# Patient Record
Sex: Female | Born: 1960
Health system: Southern US, Community
[De-identification: ages and names within clinical notes are randomized; demographics above are authoritative.]

## PROBLEM LIST (undated history)

## (undated) ENCOUNTER — Emergency Department (HOSPITAL_COMMUNITY): Discharge status: Absent without leave | Payer: Self-pay

## (undated) DIAGNOSIS — J302 Other seasonal allergic rhinitis: Secondary | ICD-10-CM

## (undated) DIAGNOSIS — L719 Rosacea, unspecified: Secondary | ICD-10-CM

## (undated) DIAGNOSIS — T7840XA Allergy, unspecified, initial encounter: Secondary | ICD-10-CM

## (undated) DIAGNOSIS — I499 Cardiac arrhythmia, unspecified: Secondary | ICD-10-CM

## (undated) DIAGNOSIS — H8110 Benign paroxysmal vertigo, unspecified ear: Secondary | ICD-10-CM

## (undated) DIAGNOSIS — J329 Chronic sinusitis, unspecified: Secondary | ICD-10-CM

## (undated) HISTORY — PX: OTHER SURGICAL HISTORY: SHX169

## (undated) HISTORY — DX: Cardiac arrhythmia, unspecified: I49.9

## (undated) HISTORY — DX: Chronic sinusitis, unspecified: J32.9

## (undated) HISTORY — PX: VAGINAL HYSTERECTOMY: SUR661

## (undated) HISTORY — PX: WISDOM TOOTH EXTRACTION: SHX21

## (undated) HISTORY — DX: Rosacea, unspecified: L71.9

## (undated) HISTORY — DX: Allergy, unspecified, initial encounter: T78.40XA

## (undated) HISTORY — DX: Other seasonal allergic rhinitis: J30.2

## (undated) HISTORY — PX: TONSILLECTOMY: SUR1361

## (undated) HISTORY — DX: Benign paroxysmal vertigo, unspecified ear: H81.10

---

## 2000-08-03 ENCOUNTER — Emergency Department (HOSPITAL_COMMUNITY): Admission: EM | Admit: 2000-08-03 | Discharge: 2000-08-03 | Payer: Self-pay | Admitting: Internal Medicine

## 2001-08-10 ENCOUNTER — Encounter (INDEPENDENT_AMBULATORY_CARE_PROVIDER_SITE_OTHER): Payer: Self-pay

## 2001-08-10 ENCOUNTER — Observation Stay (HOSPITAL_COMMUNITY): Admission: RE | Admit: 2001-08-10 | Discharge: 2001-08-11 | Payer: Self-pay | Admitting: Obstetrics and Gynecology

## 2002-12-22 HISTORY — PX: BREAST BIOPSY: SHX20

## 2002-12-22 HISTORY — PX: BREAST EXCISIONAL BIOPSY: SUR124

## 2005-12-02 ENCOUNTER — Encounter (INDEPENDENT_AMBULATORY_CARE_PROVIDER_SITE_OTHER): Payer: Self-pay | Admitting: *Deleted

## 2005-12-02 ENCOUNTER — Encounter: Admission: RE | Admit: 2005-12-02 | Discharge: 2005-12-02 | Payer: Self-pay | Admitting: General Surgery

## 2007-06-11 ENCOUNTER — Encounter: Admission: RE | Admit: 2007-06-11 | Discharge: 2007-06-11 | Payer: Self-pay | Admitting: Obstetrics and Gynecology

## 2007-09-08 ENCOUNTER — Encounter: Admission: RE | Admit: 2007-09-08 | Discharge: 2007-12-07 | Payer: Self-pay | Admitting: Orthopedic Surgery

## 2008-06-12 ENCOUNTER — Encounter: Admission: RE | Admit: 2008-06-12 | Discharge: 2008-06-12 | Payer: Self-pay | Admitting: Obstetrics and Gynecology

## 2009-06-29 ENCOUNTER — Encounter: Admission: RE | Admit: 2009-06-29 | Discharge: 2009-06-29 | Payer: Self-pay | Admitting: Obstetrics and Gynecology

## 2009-07-04 ENCOUNTER — Encounter: Admission: RE | Admit: 2009-07-04 | Discharge: 2009-07-04 | Payer: Self-pay | Admitting: Obstetrics and Gynecology

## 2010-03-01 ENCOUNTER — Encounter: Admission: RE | Admit: 2010-03-01 | Discharge: 2010-03-01 | Payer: Self-pay | Admitting: Family Medicine

## 2010-08-02 ENCOUNTER — Encounter: Admission: RE | Admit: 2010-08-02 | Discharge: 2010-08-02 | Payer: Self-pay | Admitting: Obstetrics and Gynecology

## 2010-12-22 HISTORY — PX: COLONOSCOPY: SHX174

## 2011-05-09 NOTE — H&P (Signed)
[facility]  Patient:    Adriana Romero, PARLIN Visit Number: [id number] MRN: [id number]          Service Type: Attending:  Katherine Roan, M.D. Adm. Date:  08/10/01                           History and Physical  CHIEF COMPLAINT:  Heavy painful periods.  HISTORY OF PRESENT ILLNESS:  Adriana Romero is a 50 year old, gravida 1, para 0, who presented to the office in June 2002 complaining of heavy painful periods. She gave the following history.  She had been followed for urinary frequency by her urologist, had severe dysmenorrhea and anemia.  She had been tried on numerous nonsteroidals which have been to no effect, and she desired definitive treatment.  Pap smear at that time was normal.  Blood work included anemia consistent with iron deficiency anemia and Pap smear was normal. Alternatives to definitive surgery have been discussed with Erskine Squibb, but she desires to proceed with hysterectomy.  MEDICATIONS:  Celexa, Chromagen, and Detrol.  ALLERGIES:  SULFA.  PAST MEDICAL HISTORY:  She has had LASIK eye surgery and has had a tonsillectomy.  REVIEW OF SYSTEMS:  GENERAL:  She has reading glasses.  She has narrowed angle glaucoma and had laser surgery for this.  HEART:  No history of hypertension, rheumatic fever, or heart murmur.  No history of mitral valve prolapse. LUNGS:  No chronic cough, asthma, or hay fever.  GU:  She has urgency and some urge incontinence, but no stress incontinence.  GI:  She has indigestion since being on iron and sometimes dizziness.  Denies any bowel habits change, melena, or weight loss/gain.  MUSCLES/BONES/JOINTS:  Negative.  No arthritis or fractures.  SOCIAL HISTORY:  She works at the [employer].  She drinks alcohol socially and does not smoke.  FAMILY HISTORY:  Her mother is 11 and has hypertension and emphysema.  Father is 73 and has had colon polyps and hypertension.  She has two brothers living and well and one sister living  and well.  She has a maternal grandmother with breast cancer.  PHYSICAL EXAMINATION:  VITAL SIGNS:  Weight 130, blood pressure 110/70, and pulse 80.  GENERAL:  Well-developed, well-nourished who appears to be her stated age. She is oriented to time, place, and recent events.  HEENT:  Unremarkable.  Oropharynx is not injected.  NECK:  Supple and thyroid not enlarged.  Carotid pulses are equal without bruits.  No adenopathy appreciated.  LUNGS:  Clear to auscultation and percussion.  HEART:  Normal sinus rhythm without murmurs.  BREASTS:  No masses or tenderness.  AXILLA:  Free from adenopathy.  ABDOMEN:  Soft, liver, spleen, and kidneys are not palpated.  Bowel sounds are normal and no bruits are heard.  No tenderness.  PELVIC:  Normal vulva and vagina.  Cervix is clean.  Uterus is anterior and tender.  Adnexa negative.  Rectovaginal confirms.  EXTREMITIES:  Good range of motion, equal pulses and reflexes.  IMPRESSION:  Severe dysmenorrhea, suggests endometriosis or adenomyosis.  PLAN:  Laparoscopic-assisted vaginal hysterectomy with possible TAH.  Risks and benefits and detailed informed consent have been given to husband and wife. Attending:  Katherine Roan, M.D. DD:  08/10/01 TD:  08/10/01 Job: [id number]

## 2011-05-09 NOTE — Op Note (Signed)
[facility]  Patient:    Adriana Romero, Adriana Romero Visit Number: [id number] MRN: [id number]          Service Type: DSU Location: DAY Attending Physician:  Lendon Colonel Proc. Date: 08/10/01 Adm. Date:  08/10/2001                             Operative Report  PREOPERATIVE DIAGNOSES:  Severe menorrhagia and dysmenorrhea unresponsive to conservative measures.  POSTOPERATIVE DIAGNOSES:  Severe menorrhagia and dysmenorrhea unresponsive to conservative measures, focal endometriosis of pelvis.  OPERATION PERFORMED:  Laparoscopically assisted vaginal hysterectomy.  DESCRIPTION OF PROCEDURE:  The patient was placed in lithotomy position, prepped and draped in the usual fashion for operative laparoscopy. A small transverse incision was made in the umbilicus and the Veress needle was inserted. Aspiration infusion was used and the abdomen was distended with 3 liters of CO2. A Hulka elevator was inserted into her uterus. After 3 liters of CO2 were infused, the 5 mm trocar was inserted into the umbilicus, a 5 mm scope was inserted. Visualization of the pelvis revealed the uterus to be enlarged with a small posterior fundal myoma, both ovaries were normal. There was a focal area of endometriosis in the cul-de-sac. A second 5 mm trocar was inserted in the midline and two lateral 5 mm trocars were utilized. These were inserted under direct vision.  Visualization of the pelvis was complete with a 5 mm scope. Using the 5 mm Seitzinger tripolar forceps, the utero-ovarian ligament, round ligament and tube were coagulated and cut. This was on each side. The bladder flap was created with scissors. Following this, we went down below and completed the vaginal hysterectomy by circumscribing the cervix and entering the cul-de-sac posteriorly and anteriorly, clamping the uterosacral and cardinal ligaments, uterine vessels and then removing the uterus. The uterus appeared to  be somewhat enlarged. Both ovaries were normal. The uterosacral ligaments were then carefully plicated in the midline with #0 Vicryl. Following this, we closed the peritoneum and the vagina in the vertical plane using interrupted sutures of #0 Vicryl. Following this, we went above and checked the pedicles. They were hemostatically secure. The trocar sites were hemostatically secure. The pelvis was washed with copious amounts of saline. The incisions were then closed with 4-0 PDS following which we infiltrated the incisions with 0.5% Marcaine with epinephrine. A Foley catheter was draining clear urine. [name] tolerated this procedure well and was sent to the recovery room in good condition. Attending Physician:  Lendon Colonel DD:  08/10/01 TD:  08/10/01 Job: [id number]

## 2011-07-17 ENCOUNTER — Other Ambulatory Visit: Payer: Self-pay | Admitting: Family Medicine

## 2011-07-17 DIAGNOSIS — N6312 Unspecified lump in the right breast, upper inner quadrant: Secondary | ICD-10-CM

## 2011-07-25 ENCOUNTER — Ambulatory Visit
Admission: RE | Admit: 2011-07-25 | Discharge: 2011-07-25 | Disposition: A | Discharge status: Home / Self Care | Payer: PRIVATE HEALTH INSURANCE | Source: Ambulatory Visit | Attending: Family Medicine | Admitting: Family Medicine

## 2011-07-25 DIAGNOSIS — N6312 Unspecified lump in the right breast, upper inner quadrant: Secondary | ICD-10-CM

## 2012-11-15 ENCOUNTER — Other Ambulatory Visit: Payer: Self-pay | Admitting: Dermatology

## 2012-11-23 ENCOUNTER — Other Ambulatory Visit: Payer: Self-pay | Admitting: Family Medicine

## 2012-11-23 DIAGNOSIS — Z1231 Encounter for screening mammogram for malignant neoplasm of breast: Secondary | ICD-10-CM

## 2012-12-24 ENCOUNTER — Ambulatory Visit
Admission: RE | Admit: 2012-12-24 | Discharge: 2012-12-24 | Disposition: A | Discharge status: Home / Self Care | Payer: BC Managed Care – PPO | Source: Ambulatory Visit | Attending: Family Medicine | Admitting: Family Medicine

## 2012-12-24 DIAGNOSIS — Z1231 Encounter for screening mammogram for malignant neoplasm of breast: Secondary | ICD-10-CM

## 2012-12-30 ENCOUNTER — Other Ambulatory Visit: Payer: Self-pay | Admitting: Family Medicine

## 2012-12-30 DIAGNOSIS — R928 Other abnormal and inconclusive findings on diagnostic imaging of breast: Secondary | ICD-10-CM

## 2013-01-07 ENCOUNTER — Ambulatory Visit
Admission: RE | Admit: 2013-01-07 | Discharge: 2013-01-07 | Disposition: A | Discharge status: Home / Self Care | Payer: BC Managed Care – PPO | Source: Ambulatory Visit | Attending: Family Medicine | Admitting: Family Medicine

## 2013-01-07 DIAGNOSIS — R928 Other abnormal and inconclusive findings on diagnostic imaging of breast: Secondary | ICD-10-CM

## 2013-03-15 ENCOUNTER — Other Ambulatory Visit: Payer: Self-pay | Admitting: Family Medicine

## 2013-03-15 DIAGNOSIS — R9389 Abnormal findings on diagnostic imaging of other specified body structures: Secondary | ICD-10-CM

## 2013-03-18 ENCOUNTER — Ambulatory Visit
Admission: RE | Admit: 2013-03-18 | Discharge: 2013-03-18 | Disposition: A | Discharge status: Home / Self Care | Payer: BC Managed Care – PPO | Source: Ambulatory Visit | Attending: Family Medicine | Admitting: Family Medicine

## 2013-03-18 DIAGNOSIS — R9389 Abnormal findings on diagnostic imaging of other specified body structures: Secondary | ICD-10-CM

## 2013-08-23 ENCOUNTER — Institutional Professional Consult (permissible substitution): Payer: BC Managed Care – PPO | Admitting: Pulmonary Disease

## 2013-08-25 ENCOUNTER — Ambulatory Visit (INDEPENDENT_AMBULATORY_CARE_PROVIDER_SITE_OTHER): Payer: BC Managed Care – PPO | Admitting: Pulmonary Disease

## 2013-08-25 ENCOUNTER — Encounter: Payer: Self-pay | Admitting: Pulmonary Disease

## 2013-08-25 ENCOUNTER — Ambulatory Visit (INDEPENDENT_AMBULATORY_CARE_PROVIDER_SITE_OTHER)
Admission: RE | Admit: 2013-08-25 | Discharge: 2013-08-25 | Disposition: A | Discharge status: Home / Self Care | Payer: BC Managed Care – PPO | Source: Ambulatory Visit | Attending: Pulmonary Disease | Admitting: Pulmonary Disease

## 2013-08-25 VITALS — BP 128/76 | HR 76 | Temp 98.8°F | Ht 66.0 in | Wt 157.0 lb

## 2013-08-25 DIAGNOSIS — R053 Chronic cough: Secondary | ICD-10-CM

## 2013-08-25 DIAGNOSIS — R059 Cough, unspecified: Secondary | ICD-10-CM

## 2013-08-25 DIAGNOSIS — R05 Cough: Secondary | ICD-10-CM | POA: Insufficient documentation

## 2013-08-25 MED ORDER — BENZONATATE 100 MG PO CAPS: ORAL_CAPSULE | ORAL | Status: DC

## 2013-08-25 MED ORDER — OMEPRAZOLE 40 MG PO CPDR: 40.0000 mg | DELAYED_RELEASE_CAPSULE | Freq: Two times a day (BID) | ORAL | Status: DC

## 2013-08-25 MED ORDER — BENZONATATE 100 MG PO CAPS: 100.0000 mg | ORAL_CAPSULE | Freq: Four times a day (QID) | ORAL | Status: DC | PRN

## 2013-08-25 NOTE — Addendum Note (Signed)
Addended by: Maisie Fus on: 08/25/2013 10:10 AM   Modules accepted: Orders

## 2013-08-25 NOTE — Progress Notes (Signed)
  Subjective:    Patient ID: MALAYSHA ARLEN, female    DOB: 01-Nov-1961, 52 y.o.   MRN: [id number]  HPI The pt is a 52 y/o female who I have been asked to see for chronic cough.  She has had this for approx 1 1/2 years, and thinks it started after having allergy issues.  It is dry and hacking in nature, and she clearly has a tickle/globus sensation in her throat.  She has frequent throat clearing.  It is worsened with conversation and smoke exposure, and improved with limiting voice use.  She has occasional severe cough paroxysms.  She thinks she has PND, but denies classic GERD symptoms.  She has been tried on flonase and low dose omeprazole without success.  The pt has not had a significant change in her exertional tolerance over the last 6-36mos, and has no hx of childhood asthma.  She has never smoked.  She has not had a cxr or spirometry.     Review of Systems  Constitutional: Positive for unexpected weight change. Negative for fever.  HENT: Negative for ear pain, nosebleeds, congestion, sore throat, rhinorrhea, sneezing, trouble swallowing, dental problem, postnasal drip and sinus pressure.   Eyes: Negative for redness and itching.  Respiratory: Positive for cough. Negative for chest tightness, shortness of breath and wheezing.   Cardiovascular: Positive for palpitations ( irregular heartbeat). Negative for leg swelling.  Gastrointestinal: Negative for nausea and vomiting.  Genitourinary: Negative for dysuria.  Musculoskeletal: Positive for joint swelling and arthralgias.  Skin: Negative for rash.  Neurological: Negative for headaches.  Hematological: Does not bruise/bleed easily.  Psychiatric/Behavioral: Negative for dysphoric mood. The patient is not nervous/anxious.        Objective:   Physical Exam Constitutional:  Well developed, no acute distress  HENT:  Nares patent without discharge, but she does have erythematous mucosa  Oropharynx without exudate, palate and uvula are  normal  Eyes:  Perrla, eomi, no scleral icterus  Neck:  No JVD, no TMG  Cardiovascular:  Normal rate, regular rhythm, no rubs or gallops.  No murmurs        Intact distal pulses  Pulmonary :  Normal breath sounds, no stridor or respiratory distress   No rales, rhonchi, or wheezing  Abdominal:  Soft, nondistended, bowel sounds present.  No tenderness noted.   Musculoskeletal:  No lower extremity edema noted.  Lymph Nodes:  No cervical lymphadenopathy noted  Skin:  No cyanosis noted  Neurologic:  Alert, appropriate, moves all 4 extremities without obvious deficit.         Assessment & Plan:

## 2013-08-25 NOTE — Assessment & Plan Note (Addendum)
The patient has a chronic cough for greater than one year duration, and from her description it is very likely to be upper airway in origin.  Her spirometry is normal today (although normal spiro doesn't 100% exclude cough variant asthma), her lungs are clear, and will check cxr today for completeness.  I have reviewed the various causes of upper airway cough, including postnasal drip, LPR, and cyclical coughing.  Will try aggressive treatment of these entities, and have told pt this will be a slow process.

## 2013-08-25 NOTE — Patient Instructions (Addendum)
Will check a cxr today for completeness, and call you with results. Minimize voice use as much as possible at work, and NO talking while at home No throat clearing.  Use hard candy (no mint or menthol, no cough drops..use jolly ranchers, butterscotch,etc) to bathe the back of your throat from sun-up to sundown.  Can also drink water or just swallow if you have a tickle in your throat.   Will try tessalon pearls as directed to help with cough during the day.  It will be very important to suppress cough as much as possible.  Chlorpheniramine 4mg  OTC, and take 2 at bedtime and one at lunch daily Omeprazole 40mg  one in am and pm before eating Please call in 2 weeks to give me an update, but keep in mind this will probably be a slow process.

## 2013-08-25 NOTE — Addendum Note (Signed)
Addended by: Maisie Fus on: 08/25/2013 10:00 AM   Modules accepted: Orders

## 2013-09-08 ENCOUNTER — Telehealth: Payer: Self-pay | Admitting: Pulmonary Disease

## 2013-09-08 NOTE — Telephone Encounter (Signed)
Please let pt know to continue the high dose omeprazole, chlorpheniramine, and most importantly the behavioral therapies of voice rest and hard candy during day to prevent throat clearing.  Ok to refill tessalon pearls, #30 with one fill. It sounds like she is some better, and would like to do this for another 2 weeks.  Would like to see her in office in 2 weeks.  Make a spot for her somewhere.

## 2013-09-08 NOTE — Telephone Encounter (Signed)
Called spoke with patient, advised of KC's recs as stated below.  Pt verbalized her understanding and denied any questions.  Pt did state that she never refilled her first rx for the tessalon and will do this, so no need to send rx per pt.  Pt stated that if she does need this prior to her 2 week follow up, she will call the office.  Appt scheduled with KC 10.3.14 @ 4.30pm.  Nothing further needed; will sign off. Will sign and forward to [facility] as FYI for appt date/time.

## 2013-09-08 NOTE — Telephone Encounter (Signed)
Spoke with the pt and she is calling to give a 2 week update on her cough. She states she still feels the urge to cough often especially when she speaks. She states she forces herself not to cough but feels the urge. She states she is following all recs. She wants to know is she to continue the omeprazole twice daily, chlor tabs 2 at bedtime and one at lunch? And she states she has only 3 tabs left of tessalon perles. She wants to know can she have a refill on these to have on hand for an as needed basis? Please advise. Not on File

## 2013-09-23 ENCOUNTER — Encounter: Payer: Self-pay | Admitting: Pulmonary Disease

## 2013-09-23 ENCOUNTER — Ambulatory Visit (INDEPENDENT_AMBULATORY_CARE_PROVIDER_SITE_OTHER): Payer: BC Managed Care – PPO | Admitting: Pulmonary Disease

## 2013-09-23 ENCOUNTER — Other Ambulatory Visit: Payer: Self-pay | Admitting: Pulmonary Disease

## 2013-09-23 VITALS — BP 110/68 | HR 80 | Temp 97.0°F | Ht 66.0 in | Wt 156.0 lb

## 2013-09-23 DIAGNOSIS — R059 Cough, unspecified: Secondary | ICD-10-CM

## 2013-09-23 DIAGNOSIS — R053 Chronic cough: Secondary | ICD-10-CM

## 2013-09-23 DIAGNOSIS — R05 Cough: Secondary | ICD-10-CM

## 2013-09-23 MED ORDER — OMEPRAZOLE 40 MG PO CPDR: 40.0000 mg | DELAYED_RELEASE_CAPSULE | Freq: Every day | ORAL | Status: DC

## 2013-09-23 NOTE — Telephone Encounter (Signed)
See telephone note. This has been addressed. 

## 2013-09-23 NOTE — Telephone Encounter (Signed)
Please advise on refill. Thanks. 

## 2013-09-23 NOTE — Patient Instructions (Addendum)
Decrease your omeprazole to once a day for 7 days, then try and discontinue. Stay on your allergy medication thru fall allergy season, and call me if your symptoms are not controlled.  Continue with behavioral therapies, start to liberalize voice use but do not sing or yell. Please call if you have escalation of your cough.

## 2013-09-23 NOTE — Assessment & Plan Note (Signed)
The patient's cough is almost totally resolved with treatment of reflux, postnasal drip, and cyclical coughing.  At this point, I would like to try and get her off proton pump inhibitor therapy, but would like her to continue allergy treatment through the fall season.  Most importantly, she is to continue behavioral therapies to keep her cough from escalating.

## 2013-09-23 NOTE — Progress Notes (Signed)
  Subjective:    Patient ID: Adriana Romero, female    DOB: 1961-04-14, 52 y.o.   MRN: [id number]  HPI Patient comes in today for followup of her chronic cough.  She has been treated with an antihistamine and also a proton pump inhibitor, as well as behavioral therapies.  She tells me that her cough is less than one scale of 10.  The only time that she has a sensation difficulties when she uses her voice.  She has stuck with behavioral therapy strictly since the last visit.   Review of Systems  Constitutional: Negative for fever and unexpected weight change.  HENT: Positive for sneezing and postnasal drip. Negative for ear pain, nosebleeds, congestion, sore throat, rhinorrhea, trouble swallowing, dental problem and sinus pressure.   Eyes: Negative for redness and itching.  Respiratory: Positive for cough. Negative for chest tightness, shortness of breath and wheezing.   Cardiovascular: Negative for palpitations and leg swelling.  Gastrointestinal: Negative for nausea and vomiting.  Genitourinary: Negative for dysuria.  Musculoskeletal: Negative for joint swelling.  Skin: Negative for rash.  Neurological: Negative for headaches.  Hematological: Does not bruise/bleed easily.  Psychiatric/Behavioral: Negative for dysphoric mood. The patient is not nervous/anxious.        Objective:   Physical Exam Well-developed female in no acute distress Nose without purulence or discharge noted Neck without lymphadenopathy or thyromegaly Extremities without edema, no cyanosis Alert and oriented, moves all 4 extremities.       Assessment & Plan:

## 2013-10-27 ENCOUNTER — Other Ambulatory Visit: Payer: Self-pay

## 2013-12-09 ENCOUNTER — Other Ambulatory Visit: Payer: Self-pay

## 2013-12-09 DIAGNOSIS — Z1231 Encounter for screening mammogram for malignant neoplasm of breast: Secondary | ICD-10-CM

## 2014-01-13 ENCOUNTER — Ambulatory Visit
Admission: RE | Admit: 2014-01-13 | Discharge: 2014-01-13 | Disposition: A | Discharge status: Home / Self Care | Payer: BC Managed Care – PPO | Source: Ambulatory Visit

## 2014-01-13 DIAGNOSIS — Z1231 Encounter for screening mammogram for malignant neoplasm of breast: Secondary | ICD-10-CM

## 2014-10-06 ENCOUNTER — Other Ambulatory Visit: Payer: Self-pay

## 2015-04-05 ENCOUNTER — Other Ambulatory Visit: Payer: Self-pay

## 2015-04-05 DIAGNOSIS — Z1231 Encounter for screening mammogram for malignant neoplasm of breast: Secondary | ICD-10-CM

## 2015-04-20 ENCOUNTER — Ambulatory Visit
Admission: RE | Admit: 2015-04-20 | Discharge: 2015-04-20 | Disposition: A | Discharge status: Home / Self Care | Payer: BLUE CROSS/BLUE SHIELD | Source: Ambulatory Visit

## 2015-04-20 DIAGNOSIS — Z1231 Encounter for screening mammogram for malignant neoplasm of breast: Secondary | ICD-10-CM

## 2015-09-19 ENCOUNTER — Encounter: Payer: Self-pay | Admitting: Gynecology

## 2015-09-19 ENCOUNTER — Ambulatory Visit (INDEPENDENT_AMBULATORY_CARE_PROVIDER_SITE_OTHER): Payer: BLUE CROSS/BLUE SHIELD | Admitting: Gynecology

## 2015-09-19 ENCOUNTER — Other Ambulatory Visit (HOSPITAL_COMMUNITY)
Admission: RE | Admit: 2015-09-19 | Discharge: 2015-09-19 | Disposition: A | Discharge status: Home / Self Care | Payer: BLUE CROSS/BLUE SHIELD | Source: Ambulatory Visit | Attending: Gynecology | Admitting: Gynecology

## 2015-09-19 VITALS — BP 116/70 | Ht 66.0 in | Wt 149.0 lb

## 2015-09-19 DIAGNOSIS — N952 Postmenopausal atrophic vaginitis: Secondary | ICD-10-CM

## 2015-09-19 DIAGNOSIS — N951 Menopausal and female climacteric states: Secondary | ICD-10-CM | POA: Diagnosis not present

## 2015-09-19 DIAGNOSIS — Z1272 Encounter for screening for malignant neoplasm of vagina: Secondary | ICD-10-CM

## 2015-09-19 DIAGNOSIS — N898 Other specified noninflammatory disorders of vagina: Secondary | ICD-10-CM | POA: Diagnosis not present

## 2015-09-19 DIAGNOSIS — Z01419 Encounter for gynecological examination (general) (routine) without abnormal findings: Secondary | ICD-10-CM | POA: Insufficient documentation

## 2015-09-19 LAB — WET PREP FOR TRICH, YEAST, CLUE
TRICH WET PREP: NONE SEEN
YEAST WET PREP: NONE SEEN

## 2015-09-19 MED ORDER — ESTRADIOL 0.075 MG/24HR TD PTTW: 1.0000 | MEDICATED_PATCH | TRANSDERMAL | Status: DC

## 2015-09-19 NOTE — Patient Instructions (Signed)
Start on the estrogen patches as we discussed. Call me if you have any issues with this. Follow up in one year if doing well.

## 2015-09-19 NOTE — Progress Notes (Signed)
Adriana Romero March 27, 1961 [id number]        54 y.o.  G1P0010 New patient presents in consultation from Marda Stalker secondary to worsening hot flashes, night sweats, vaginal dryness with significant dyspareunia. Status post vaginal hysterectomy in her early 52s for persistent abnormal bleeding. Transiently tried Premarin but was discontinued secondary to some concern about thrombosis. Father with history of embolus in his 57s. Grandmother with history of DVT at an elderly age. No other family history. Has tried over-the-counter moisturizers and lubricants but feels like razor blades when he has intercourse. Heart flashes and night sweats have been going on for 7-8 years without relief. Has tried over-the-counter products without success.  Recently had a full exam by her primary physician to include pelvic and breast exam. Relates not having had a Pap smear in a number of years.  Past medical history,surgical history, problem list, medications, allergies, family history and social history were all reviewed and documented in the EPIC chart.  Directed ROS with pertinent positives and negatives documented in the history of present illness/assessment and plan.  Exam: Kim assistant Filed Vitals:   09/19/15 1053  BP: 116/70  Height: 5\' 6"  (1.676 m)  Weight: 149 lb (67.586 kg)   General appearance:  Normal Both breasts examined lying and sitting without masses retractions discharge adenopathy. Abdomen soft nontender without masses guarding rebound Pelvic external BUS vagina with atrophic changes. Slight white discharge noted. Pap smear of vaginal cuff done. Bimanual without masses or tenderness. Rectal exam is normal.  Assessment/Plan:  54 y.o. G1P0010 with significant postmenopausal symptoms to include hot flashes and night sweats, vaginal dryness and dyspareunia. Options for management were reviewed to include over-the-counter products to address both the global symptoms and the vaginal symptoms.  Pharmacologic nonhormonal such as Effexor which apparently she has tried in the past without success. Estrogen such as vaginal estrogen cream/rings/Vagifem and Osphena. Risks and benefits of each choice was reviewed.  Global ERT with various routes to include pills patch vaginal ring topical lotions/sprays all discussed. The WHI study with increased risks of stroke heart attack DVT and breast cancer reviewed. Does have a history of DVT/PE although both elderly in occurrence. Possibilities of genetic hypercoagulable state and availability of genetic screening discussed. Given the sparse history at this point we both feel comfortable not screening but she does understand the issues. After a lengthy discussion she does want to try ERT and will initiate with estradiol 0.075 mg patch. Benefits from a thrombosis standpoint for transdermal absorption discussed. Patient will call me if she does not receive a satisfactory result as far as hot flashes/night sweats relief within the first month and vaginal symptoms within the first 2 months. Use of oil based vaginal lubricants also discussed. Assuming patient does have a good result then she'll follow expectantly and see me in one year otherwise she'll follow up sooner with any issues.   She is status post hysterectomy for benign indications. I reviewed current Pap smear screening recommendations and the options to stop screening discussed. As it has been a number of years since she has had a Pap smear and she does relate some abnormal Pap smears when she was much younger I did a baseline Pap smear today.    Anastasio Auerbach MD, 12:00 PM 09/19/2015

## 2015-09-19 NOTE — Addendum Note (Signed)
Addended by: Nelva Nay on: 09/19/2015 12:27 PM   Modules accepted: Orders

## 2015-09-20 ENCOUNTER — Telehealth: Payer: Self-pay | Admitting: *Deleted

## 2015-09-20 NOTE — Telephone Encounter (Signed)
Pt called was prescribed estradiol 0.075 mg patch on OV 09/19/15, was concerned if she wanted to stop the patch after 6 months would this be okay. Pt read some of the side effects, I explained to patient that she could stop anytime if she wanted to, would just need to call office to find out how to wean off Rx. Pt was fine with this and will call if further questions.

## 2015-09-24 LAB — CYTOLOGY - PAP

## 2015-10-02 ENCOUNTER — Telehealth: Payer: Self-pay | Admitting: *Deleted

## 2015-10-02 NOTE — Telephone Encounter (Signed)
Pt was started on estradiol 0.075 mg patch on 09/19/15 OV, wishes to stop taking HRT asked how to wean off? Please Arnoldo Hooker

## 2015-10-02 NOTE — Telephone Encounter (Signed)
Left instructions on her voice mail per [facility] access note. To call if questions.

## 2015-10-02 NOTE — Telephone Encounter (Signed)
Cut patch in half and use half a patch twice weekly for 2 weeks. Cut again in one half (one quarter of a whole patch) and do this for 2 weeks and then stop.

## 2016-03-12 ENCOUNTER — Other Ambulatory Visit: Payer: Self-pay

## 2016-03-12 DIAGNOSIS — Z1231 Encounter for screening mammogram for malignant neoplasm of breast: Secondary | ICD-10-CM

## 2016-03-14 ENCOUNTER — Other Ambulatory Visit: Payer: Self-pay | Admitting: Family Medicine

## 2016-03-14 DIAGNOSIS — N644 Mastodynia: Secondary | ICD-10-CM

## 2016-03-21 ENCOUNTER — Ambulatory Visit
Admission: RE | Admit: 2016-03-21 | Discharge: 2016-03-21 | Disposition: A | Discharge status: Home / Self Care | Payer: BLUE CROSS/BLUE SHIELD | Source: Ambulatory Visit | Attending: Family Medicine | Admitting: Family Medicine

## 2016-03-21 ENCOUNTER — Other Ambulatory Visit: Payer: Self-pay | Admitting: Family Medicine

## 2016-03-21 DIAGNOSIS — N644 Mastodynia: Secondary | ICD-10-CM

## 2016-05-14 DIAGNOSIS — D2262 Melanocytic nevi of left upper limb, including shoulder: Secondary | ICD-10-CM | POA: Diagnosis not present

## 2016-05-14 DIAGNOSIS — L738 Other specified follicular disorders: Secondary | ICD-10-CM | POA: Diagnosis not present

## 2016-05-14 DIAGNOSIS — L814 Other melanin hyperpigmentation: Secondary | ICD-10-CM | POA: Diagnosis not present

## 2016-05-14 DIAGNOSIS — L718 Other rosacea: Secondary | ICD-10-CM | POA: Diagnosis not present

## 2016-06-10 DIAGNOSIS — H2513 Age-related nuclear cataract, bilateral: Secondary | ICD-10-CM | POA: Diagnosis not present

## 2016-06-10 DIAGNOSIS — H35371 Puckering of macula, right eye: Secondary | ICD-10-CM | POA: Diagnosis not present

## 2016-06-10 DIAGNOSIS — H5203 Hypermetropia, bilateral: Secondary | ICD-10-CM | POA: Diagnosis not present

## 2016-06-11 DIAGNOSIS — L720 Epidermal cyst: Secondary | ICD-10-CM | POA: Diagnosis not present

## 2016-09-03 DIAGNOSIS — Z0000 Encounter for general adult medical examination without abnormal findings: Secondary | ICD-10-CM | POA: Diagnosis not present

## 2016-09-03 DIAGNOSIS — Z1159 Encounter for screening for other viral diseases: Secondary | ICD-10-CM | POA: Diagnosis not present

## 2016-09-03 DIAGNOSIS — Z23 Encounter for immunization: Secondary | ICD-10-CM | POA: Diagnosis not present

## 2016-12-23 DIAGNOSIS — R3915 Urgency of urination: Secondary | ICD-10-CM | POA: Diagnosis not present

## 2017-03-31 DIAGNOSIS — M722 Plantar fascial fibromatosis: Secondary | ICD-10-CM | POA: Diagnosis not present

## 2017-06-12 DIAGNOSIS — H35371 Puckering of macula, right eye: Secondary | ICD-10-CM | POA: Diagnosis not present

## 2017-06-12 DIAGNOSIS — H5203 Hypermetropia, bilateral: Secondary | ICD-10-CM | POA: Diagnosis not present

## 2017-08-17 ENCOUNTER — Other Ambulatory Visit: Payer: Self-pay | Admitting: Family Medicine

## 2017-08-17 DIAGNOSIS — Z1231 Encounter for screening mammogram for malignant neoplasm of breast: Secondary | ICD-10-CM

## 2017-08-28 ENCOUNTER — Ambulatory Visit: Payer: BLUE CROSS/BLUE SHIELD

## 2017-08-31 ENCOUNTER — Ambulatory Visit
Admission: RE | Admit: 2017-08-31 | Discharge: 2017-08-31 | Disposition: A | Discharge status: Home / Self Care | Payer: BLUE CROSS/BLUE SHIELD | Source: Ambulatory Visit | Attending: Family Medicine | Admitting: Family Medicine

## 2017-08-31 DIAGNOSIS — Z1231 Encounter for screening mammogram for malignant neoplasm of breast: Secondary | ICD-10-CM

## 2017-09-08 DIAGNOSIS — Z13220 Encounter for screening for lipoid disorders: Secondary | ICD-10-CM | POA: Diagnosis not present

## 2017-09-08 DIAGNOSIS — Z0000 Encounter for general adult medical examination without abnormal findings: Secondary | ICD-10-CM | POA: Diagnosis not present

## 2017-09-08 DIAGNOSIS — Z23 Encounter for immunization: Secondary | ICD-10-CM | POA: Diagnosis not present

## 2018-06-04 DIAGNOSIS — Z780 Asymptomatic menopausal state: Secondary | ICD-10-CM | POA: Diagnosis not present

## 2018-06-09 DIAGNOSIS — M79672 Pain in left foot: Secondary | ICD-10-CM | POA: Diagnosis not present

## 2018-06-22 DIAGNOSIS — M79672 Pain in left foot: Secondary | ICD-10-CM | POA: Diagnosis not present

## 2018-07-02 DIAGNOSIS — H2513 Age-related nuclear cataract, bilateral: Secondary | ICD-10-CM | POA: Diagnosis not present

## 2018-07-02 DIAGNOSIS — H35371 Puckering of macula, right eye: Secondary | ICD-10-CM | POA: Diagnosis not present

## 2018-07-02 DIAGNOSIS — H5203 Hypermetropia, bilateral: Secondary | ICD-10-CM | POA: Diagnosis not present

## 2018-07-15 DIAGNOSIS — M25572 Pain in left ankle and joints of left foot: Secondary | ICD-10-CM | POA: Diagnosis not present

## 2018-07-15 DIAGNOSIS — M898X7 Other specified disorders of bone, ankle and foot: Secondary | ICD-10-CM | POA: Diagnosis not present

## 2018-07-16 DIAGNOSIS — Z90710 Acquired absence of both cervix and uterus: Secondary | ICD-10-CM | POA: Diagnosis not present

## 2018-07-16 DIAGNOSIS — R6882 Decreased libido: Secondary | ICD-10-CM | POA: Diagnosis not present

## 2018-07-16 DIAGNOSIS — Z780 Asymptomatic menopausal state: Secondary | ICD-10-CM | POA: Diagnosis not present

## 2018-07-16 DIAGNOSIS — N941 Unspecified dyspareunia: Secondary | ICD-10-CM | POA: Diagnosis not present

## 2018-08-03 DIAGNOSIS — D225 Melanocytic nevi of trunk: Secondary | ICD-10-CM | POA: Diagnosis not present

## 2018-08-03 DIAGNOSIS — D1801 Hemangioma of skin and subcutaneous tissue: Secondary | ICD-10-CM | POA: Diagnosis not present

## 2018-08-03 DIAGNOSIS — L570 Actinic keratosis: Secondary | ICD-10-CM | POA: Diagnosis not present

## 2018-08-03 DIAGNOSIS — L918 Other hypertrophic disorders of the skin: Secondary | ICD-10-CM | POA: Diagnosis not present

## 2018-08-03 DIAGNOSIS — D2262 Melanocytic nevi of left upper limb, including shoulder: Secondary | ICD-10-CM | POA: Diagnosis not present

## 2018-08-18 DIAGNOSIS — M25572 Pain in left ankle and joints of left foot: Secondary | ICD-10-CM | POA: Diagnosis not present

## 2018-08-18 DIAGNOSIS — M25561 Pain in right knee: Secondary | ICD-10-CM | POA: Diagnosis not present

## 2018-09-02 DIAGNOSIS — L720 Epidermal cyst: Secondary | ICD-10-CM | POA: Diagnosis not present

## 2018-09-09 DIAGNOSIS — Z4802 Encounter for removal of sutures: Secondary | ICD-10-CM | POA: Diagnosis not present

## 2018-09-10 DIAGNOSIS — N941 Unspecified dyspareunia: Secondary | ICD-10-CM | POA: Diagnosis not present

## 2018-09-10 DIAGNOSIS — Z23 Encounter for immunization: Secondary | ICD-10-CM | POA: Diagnosis not present

## 2018-09-10 DIAGNOSIS — Z136 Encounter for screening for cardiovascular disorders: Secondary | ICD-10-CM | POA: Diagnosis not present

## 2018-09-10 DIAGNOSIS — Z1211 Encounter for screening for malignant neoplasm of colon: Secondary | ICD-10-CM | POA: Diagnosis not present

## 2018-09-10 DIAGNOSIS — Z0000 Encounter for general adult medical examination without abnormal findings: Secondary | ICD-10-CM | POA: Diagnosis not present

## 2018-09-24 DIAGNOSIS — M25572 Pain in left ankle and joints of left foot: Secondary | ICD-10-CM | POA: Diagnosis not present

## 2018-10-15 DIAGNOSIS — N951 Menopausal and female climacteric states: Secondary | ICD-10-CM | POA: Diagnosis not present

## 2018-10-15 DIAGNOSIS — R6882 Decreased libido: Secondary | ICD-10-CM | POA: Diagnosis not present

## 2018-10-15 DIAGNOSIS — Z780 Asymptomatic menopausal state: Secondary | ICD-10-CM | POA: Diagnosis not present

## 2018-10-15 DIAGNOSIS — N941 Unspecified dyspareunia: Secondary | ICD-10-CM | POA: Diagnosis not present

## 2019-01-22 DIAGNOSIS — H35041 Retinal micro-aneurysms, unspecified, right eye: Secondary | ICD-10-CM

## 2019-01-22 HISTORY — DX: Retinal micro-aneurysms, unspecified, right eye: H35.041

## 2019-02-02 ENCOUNTER — Other Ambulatory Visit: Payer: Self-pay | Admitting: Family Medicine

## 2019-02-02 DIAGNOSIS — Z1231 Encounter for screening mammogram for malignant neoplasm of breast: Secondary | ICD-10-CM

## 2019-02-08 DIAGNOSIS — R599 Enlarged lymph nodes, unspecified: Secondary | ICD-10-CM | POA: Diagnosis not present

## 2019-02-08 DIAGNOSIS — K219 Gastro-esophageal reflux disease without esophagitis: Secondary | ICD-10-CM | POA: Diagnosis not present

## 2019-02-25 ENCOUNTER — Ambulatory Visit
Admission: RE | Admit: 2019-02-25 | Discharge: 2019-02-25 | Disposition: A | Discharge status: Home / Self Care | Payer: BLUE CROSS/BLUE SHIELD | Source: Ambulatory Visit | Attending: Family Medicine | Admitting: Family Medicine

## 2019-02-25 DIAGNOSIS — Z1231 Encounter for screening mammogram for malignant neoplasm of breast: Secondary | ICD-10-CM

## 2019-02-25 DIAGNOSIS — H3500 Unspecified background retinopathy: Secondary | ICD-10-CM | POA: Diagnosis not present

## 2019-03-01 DIAGNOSIS — H3581 Retinal edema: Secondary | ICD-10-CM | POA: Diagnosis not present

## 2019-03-01 DIAGNOSIS — H3561 Retinal hemorrhage, right eye: Secondary | ICD-10-CM | POA: Diagnosis not present

## 2019-03-01 DIAGNOSIS — H33103 Unspecified retinoschisis, bilateral: Secondary | ICD-10-CM | POA: Diagnosis not present

## 2019-03-01 DIAGNOSIS — H35033 Hypertensive retinopathy, bilateral: Secondary | ICD-10-CM | POA: Diagnosis not present

## 2019-03-01 DIAGNOSIS — H4311 Vitreous hemorrhage, right eye: Secondary | ICD-10-CM | POA: Diagnosis not present

## 2019-03-09 DIAGNOSIS — H3561 Retinal hemorrhage, right eye: Secondary | ICD-10-CM | POA: Diagnosis not present

## 2019-03-09 DIAGNOSIS — H3581 Retinal edema: Secondary | ICD-10-CM | POA: Diagnosis not present

## 2019-03-09 DIAGNOSIS — H53131 Sudden visual loss, right eye: Secondary | ICD-10-CM | POA: Diagnosis not present

## 2019-03-09 DIAGNOSIS — H4311 Vitreous hemorrhage, right eye: Secondary | ICD-10-CM | POA: Diagnosis not present

## 2019-03-09 DIAGNOSIS — H34831 Tributary (branch) retinal vein occlusion, right eye, with macular edema: Secondary | ICD-10-CM | POA: Diagnosis not present

## 2019-03-10 DIAGNOSIS — H3561 Retinal hemorrhage, right eye: Secondary | ICD-10-CM | POA: Diagnosis not present

## 2019-03-10 DIAGNOSIS — H53131 Sudden visual loss, right eye: Secondary | ICD-10-CM | POA: Diagnosis not present

## 2019-03-10 DIAGNOSIS — H34831 Tributary (branch) retinal vein occlusion, right eye, with macular edema: Secondary | ICD-10-CM | POA: Diagnosis not present

## 2019-03-10 DIAGNOSIS — H3581 Retinal edema: Secondary | ICD-10-CM | POA: Diagnosis not present

## 2019-03-10 DIAGNOSIS — H35351 Cystoid macular degeneration, right eye: Secondary | ICD-10-CM | POA: Diagnosis not present

## 2019-03-11 DIAGNOSIS — H34831 Tributary (branch) retinal vein occlusion, right eye, with macular edema: Secondary | ICD-10-CM | POA: Diagnosis not present

## 2019-03-18 DIAGNOSIS — H3509 Other intraretinal microvascular abnormalities: Secondary | ICD-10-CM | POA: Diagnosis not present

## 2019-03-21 DIAGNOSIS — H35033 Hypertensive retinopathy, bilateral: Secondary | ICD-10-CM | POA: Diagnosis not present

## 2019-03-21 DIAGNOSIS — H35351 Cystoid macular degeneration, right eye: Secondary | ICD-10-CM | POA: Diagnosis not present

## 2019-03-21 DIAGNOSIS — H3581 Retinal edema: Secondary | ICD-10-CM | POA: Diagnosis not present

## 2019-03-21 DIAGNOSIS — H3561 Retinal hemorrhage, right eye: Secondary | ICD-10-CM | POA: Diagnosis not present

## 2019-03-24 DIAGNOSIS — H53411 Scotoma involving central area, right eye: Secondary | ICD-10-CM | POA: Diagnosis not present

## 2019-03-24 DIAGNOSIS — H538 Other visual disturbances: Secondary | ICD-10-CM | POA: Diagnosis not present

## 2019-03-24 DIAGNOSIS — H3581 Retinal edema: Secondary | ICD-10-CM | POA: Diagnosis not present

## 2019-03-24 DIAGNOSIS — H35351 Cystoid macular degeneration, right eye: Secondary | ICD-10-CM | POA: Diagnosis not present

## 2019-03-24 DIAGNOSIS — H34831 Tributary (branch) retinal vein occlusion, right eye, with macular edema: Secondary | ICD-10-CM | POA: Diagnosis not present

## 2019-03-24 DIAGNOSIS — H35721 Serous detachment of retinal pigment epithelium, right eye: Secondary | ICD-10-CM | POA: Diagnosis not present

## 2019-03-24 DIAGNOSIS — H5315 Visual distortions of shape and size: Secondary | ICD-10-CM | POA: Diagnosis not present

## 2019-03-31 DIAGNOSIS — H35351 Cystoid macular degeneration, right eye: Secondary | ICD-10-CM | POA: Diagnosis not present

## 2019-04-13 DIAGNOSIS — H34831 Tributary (branch) retinal vein occlusion, right eye, with macular edema: Secondary | ICD-10-CM | POA: Diagnosis not present

## 2019-05-20 DIAGNOSIS — H34831 Tributary (branch) retinal vein occlusion, right eye, with macular edema: Secondary | ICD-10-CM | POA: Diagnosis not present

## 2019-06-22 DIAGNOSIS — H34831 Tributary (branch) retinal vein occlusion, right eye, with macular edema: Secondary | ICD-10-CM | POA: Diagnosis not present

## 2019-07-29 DIAGNOSIS — H34831 Tributary (branch) retinal vein occlusion, right eye, with macular edema: Secondary | ICD-10-CM | POA: Diagnosis not present

## 2019-09-02 DIAGNOSIS — H34831 Tributary (branch) retinal vein occlusion, right eye, with macular edema: Secondary | ICD-10-CM | POA: Diagnosis not present

## 2019-09-08 IMAGING — MG DIGITAL SCREENING BILATERAL MAMMOGRAM WITH TOMO AND CAD
8 series · 8 of 24 positions shown · non-contrast
Comparison: Previous exam(s).

CLINICAL DATA: Screening.

EXAM:
DIGITAL SCREENING BILATERAL MAMMOGRAM WITH TOMO AND CAD

[R MLO synth-2D]
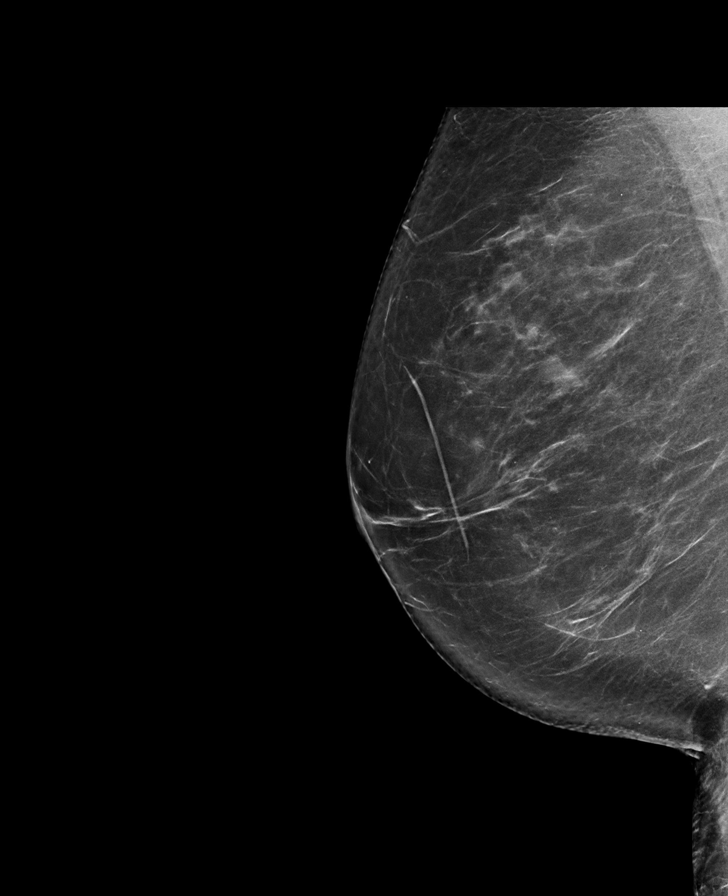

[L CC synth-2D]
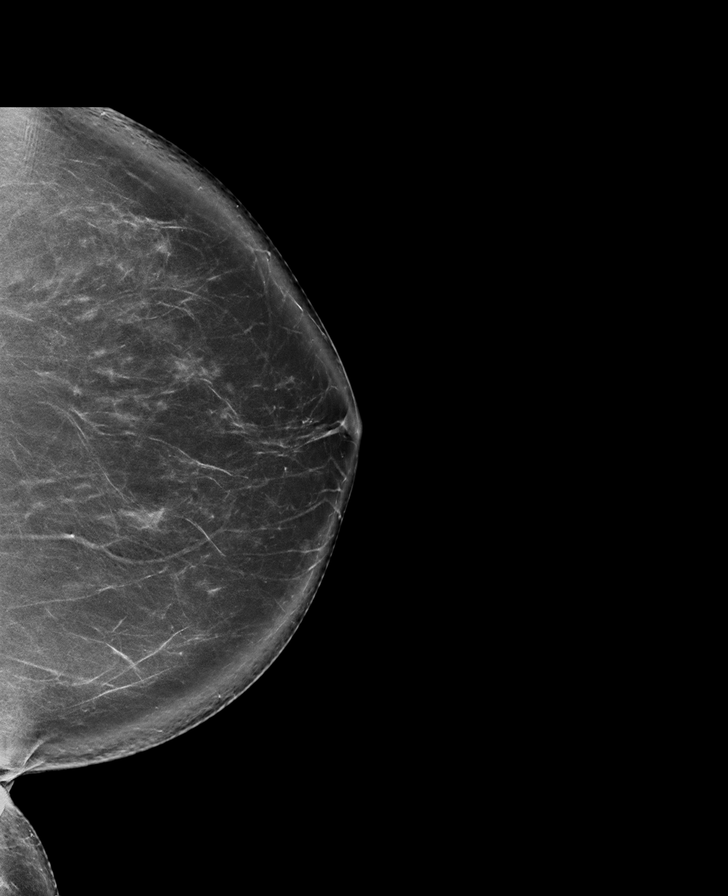

[L MLO synth-2D]
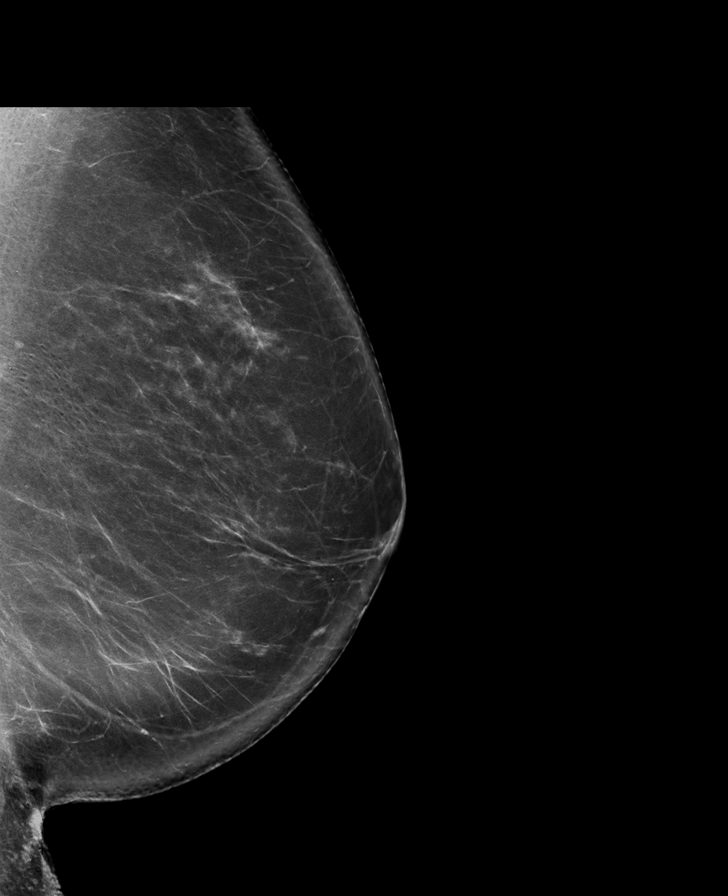

[R CC synth-2D]
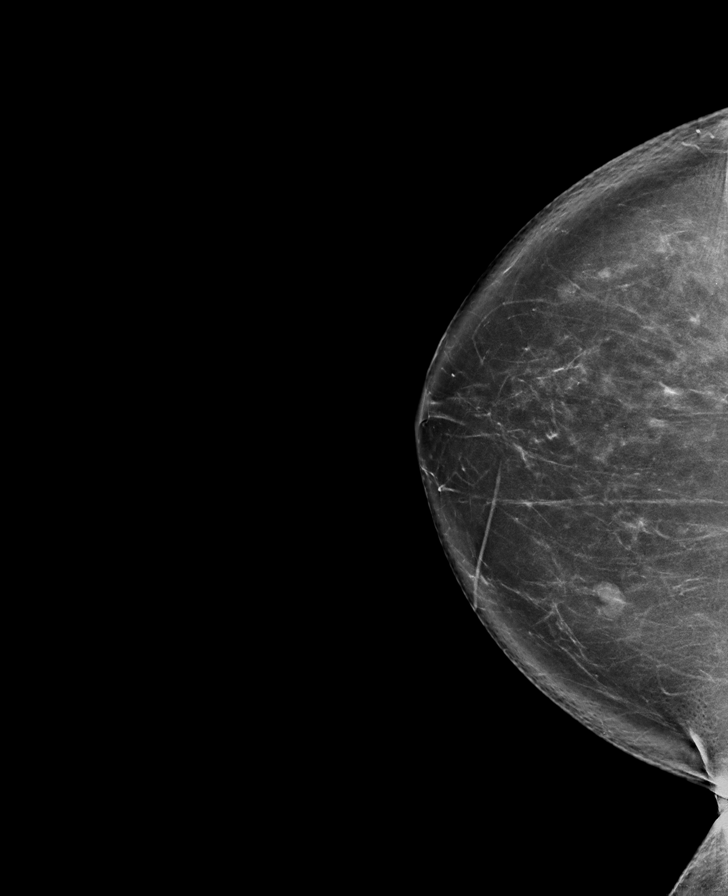

[R MLO tomo · tomo slice 45/89.0]
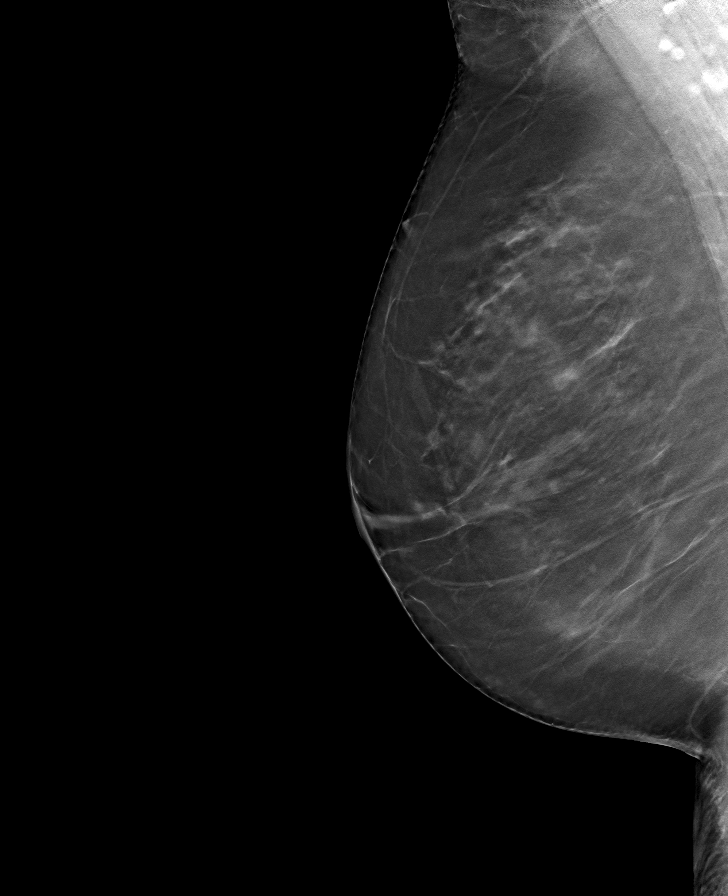

[R CC tomo · tomo slice 47/94.0]
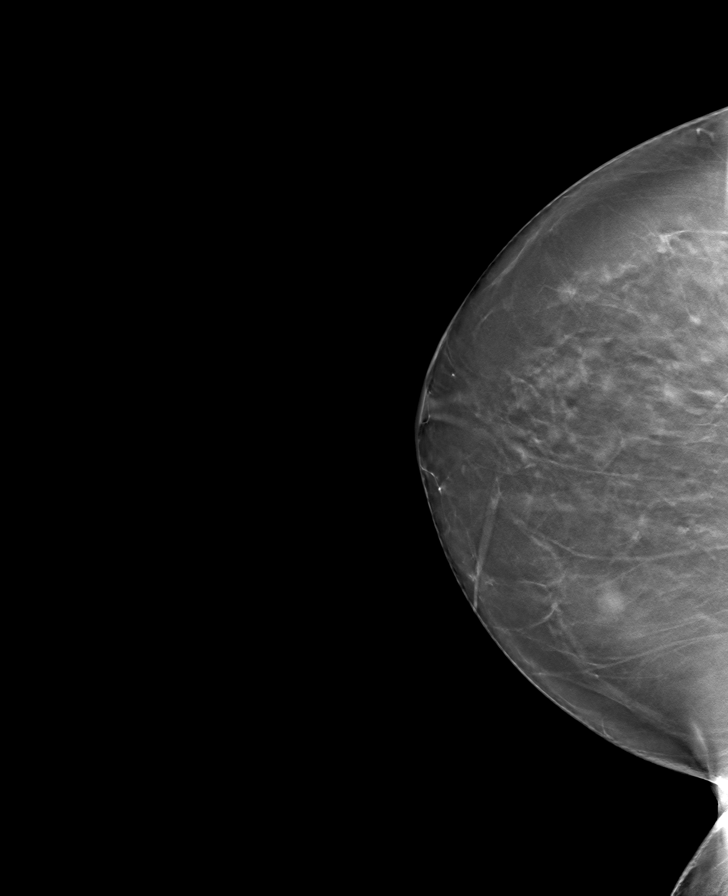

[L MLO tomo · tomo slice 49/96.0]
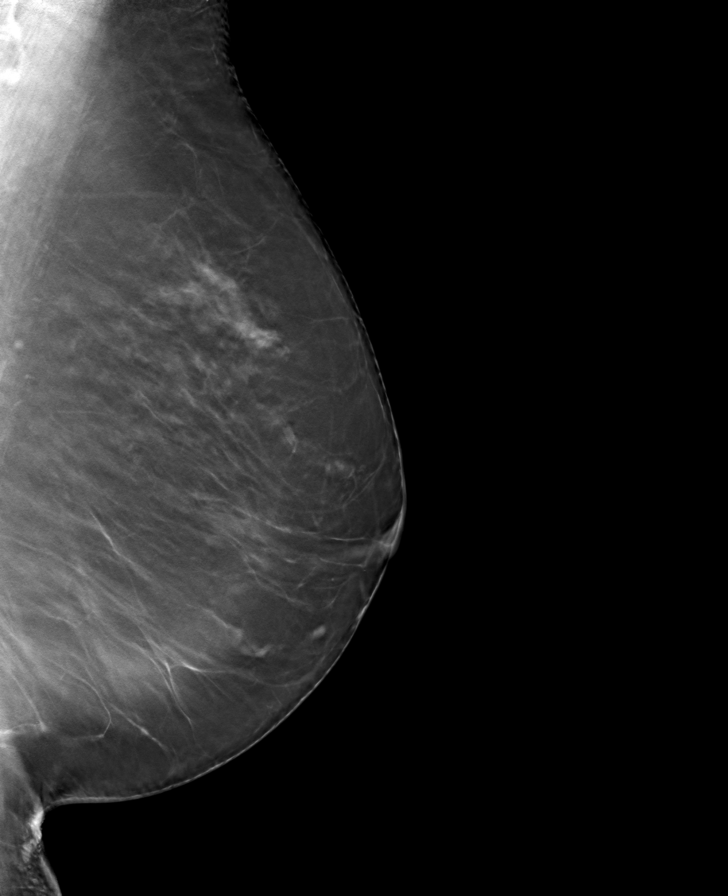

[L CC tomo · tomo slice 50/99.0]
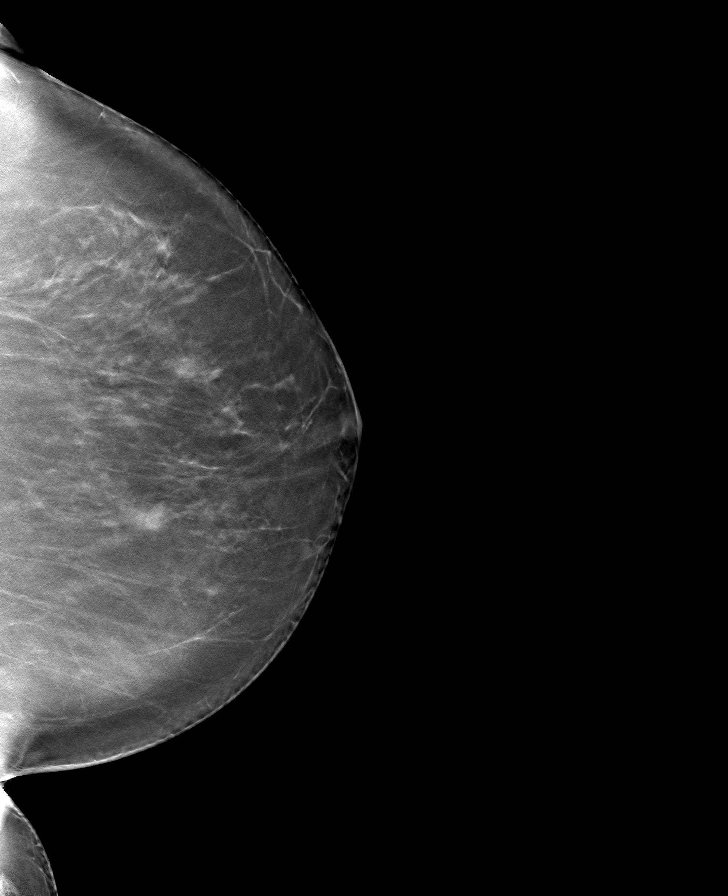

[8 of 24 positions shown; findings below may reference images not displayed]

ACR Breast Density Category b: There are scattered areas of
fibroglandular density.
FINDINGS: There are no findings suspicious for malignancy. Images were
processed with CAD.
IMPRESSION: No mammographic evidence of malignancy. A result letter of this
screening mammogram will be mailed directly to the patient.

RECOMMENDATION:
Screening mammogram in one year. (Code:[id number])

BI-RADS CATEGORY  1: Negative.

## 2019-09-09 DIAGNOSIS — N941 Unspecified dyspareunia: Secondary | ICD-10-CM | POA: Diagnosis not present

## 2019-09-09 DIAGNOSIS — Z13220 Encounter for screening for lipoid disorders: Secondary | ICD-10-CM | POA: Diagnosis not present

## 2019-09-09 DIAGNOSIS — Z23 Encounter for immunization: Secondary | ICD-10-CM | POA: Diagnosis not present

## 2019-09-09 DIAGNOSIS — Z1211 Encounter for screening for malignant neoplasm of colon: Secondary | ICD-10-CM | POA: Diagnosis not present

## 2019-09-09 DIAGNOSIS — Z0000 Encounter for general adult medical examination without abnormal findings: Secondary | ICD-10-CM | POA: Diagnosis not present

## 2019-09-16 DIAGNOSIS — D225 Melanocytic nevi of trunk: Secondary | ICD-10-CM | POA: Diagnosis not present

## 2019-09-16 DIAGNOSIS — D1801 Hemangioma of skin and subcutaneous tissue: Secondary | ICD-10-CM | POA: Diagnosis not present

## 2019-09-16 DIAGNOSIS — L821 Other seborrheic keratosis: Secondary | ICD-10-CM | POA: Diagnosis not present

## 2019-09-16 DIAGNOSIS — L570 Actinic keratosis: Secondary | ICD-10-CM | POA: Diagnosis not present

## 2019-09-16 DIAGNOSIS — L814 Other melanin hyperpigmentation: Secondary | ICD-10-CM | POA: Diagnosis not present

## 2019-09-20 ENCOUNTER — Encounter: Payer: Self-pay | Admitting: Gynecology

## 2019-10-13 DIAGNOSIS — H34831 Tributary (branch) retinal vein occlusion, right eye, with macular edema: Secondary | ICD-10-CM | POA: Diagnosis not present

## 2019-10-17 DIAGNOSIS — H34831 Tributary (branch) retinal vein occlusion, right eye, with macular edema: Secondary | ICD-10-CM | POA: Diagnosis not present

## 2019-10-17 DIAGNOSIS — H5711 Ocular pain, right eye: Secondary | ICD-10-CM | POA: Diagnosis not present

## 2019-10-17 DIAGNOSIS — H3581 Retinal edema: Secondary | ICD-10-CM | POA: Diagnosis not present

## 2019-10-17 DIAGNOSIS — H11431 Conjunctival hyperemia, right eye: Secondary | ICD-10-CM | POA: Diagnosis not present

## 2019-11-01 ENCOUNTER — Other Ambulatory Visit: Payer: Self-pay

## 2019-11-01 DIAGNOSIS — Z20822 Contact with and (suspected) exposure to covid-19: Secondary | ICD-10-CM

## 2019-11-02 LAB — NOVEL CORONAVIRUS, NAA: SARS-CoV-2, NAA: NOT DETECTED

## 2019-12-02 DIAGNOSIS — H34831 Tributary (branch) retinal vein occlusion, right eye, with macular edema: Secondary | ICD-10-CM | POA: Diagnosis not present

## 2020-01-06 DIAGNOSIS — H5203 Hypermetropia, bilateral: Secondary | ICD-10-CM | POA: Diagnosis not present

## 2020-01-06 DIAGNOSIS — H35371 Puckering of macula, right eye: Secondary | ICD-10-CM | POA: Diagnosis not present

## 2020-01-27 DIAGNOSIS — H34831 Tributary (branch) retinal vein occlusion, right eye, with macular edema: Secondary | ICD-10-CM | POA: Diagnosis not present

## 2020-02-25 ENCOUNTER — Ambulatory Visit: Payer: BC Managed Care – PPO | Attending: Internal Medicine

## 2020-02-25 DIAGNOSIS — Z23 Encounter for immunization: Secondary | ICD-10-CM | POA: Insufficient documentation

## 2020-02-25 NOTE — Progress Notes (Signed)
   Covid-19 Vaccination Clinic  Name:  Adriana Romero    MRN: [id number] DOB: [birth year]  02/25/2020  [name] was observed post Covid-19 immunization for 15 minutes without incident. She was provided with Vaccine Information Sheet and instruction to access the V-Safe system.   Ms. Down was instructed to call 911 with any severe reactions post vaccine: Marland Kitchen Difficulty breathing  . Swelling of face and throat  . A fast heartbeat  . A bad rash all over body  . Dizziness and weakness   Immunizations Administered    Name Date Dose VIS Date Route   Pfizer COVID-19 Vaccine 02/25/2020 12:30 PM 0.3 mL 12/02/2019 Intramuscular   Manufacturer: [place]   Lot: [id number]   [place]: [id number]

## 2020-03-15 ENCOUNTER — Other Ambulatory Visit: Payer: Self-pay | Admitting: Family Medicine

## 2020-03-15 DIAGNOSIS — Z1231 Encounter for screening mammogram for malignant neoplasm of breast: Secondary | ICD-10-CM

## 2020-03-17 ENCOUNTER — Ambulatory Visit: Payer: BC Managed Care – PPO | Attending: Internal Medicine

## 2020-03-17 DIAGNOSIS — Z23 Encounter for immunization: Secondary | ICD-10-CM

## 2020-03-17 NOTE — Progress Notes (Signed)
   Covid-19 Vaccination Clinic  Name:  Adriana Romero    MRN: [id number] DOB: [birth year]  03/17/2020  [name] was observed post Covid-19 immunization for 15 minutes without incident. She was provided with Vaccine Information Sheet and instruction to access the V-Safe system.   [name] was instructed to call 911 with any severe reactions post vaccine: Marland Kitchen Difficulty breathing  . Swelling of face and throat  . A fast heartbeat  . A bad rash all over body  . Dizziness and weakness   Immunizations Administered    Name Date Dose VIS Date Route   Pfizer COVID-19 Vaccine 03/17/2020 12:24 PM 0.3 mL 12/02/2019 Intramuscular   Manufacturer: [place]   Lot: [id number]   [place]: [id number]

## 2020-04-04 DIAGNOSIS — H34831 Tributary (branch) retinal vein occlusion, right eye, with macular edema: Secondary | ICD-10-CM | POA: Diagnosis not present

## 2020-04-27 ENCOUNTER — Other Ambulatory Visit: Payer: Self-pay

## 2020-04-27 ENCOUNTER — Ambulatory Visit
Admission: RE | Admit: 2020-04-27 | Discharge: 2020-04-27 | Disposition: A | Discharge status: Home / Self Care | Payer: BC Managed Care – PPO | Source: Ambulatory Visit | Attending: Family Medicine | Admitting: Family Medicine

## 2020-04-27 DIAGNOSIS — Z1231 Encounter for screening mammogram for malignant neoplasm of breast: Secondary | ICD-10-CM | POA: Diagnosis not present

## 2020-05-11 ENCOUNTER — Ambulatory Visit (INDEPENDENT_AMBULATORY_CARE_PROVIDER_SITE_OTHER): Payer: BC Managed Care – PPO | Admitting: Otolaryngology

## 2020-05-11 ENCOUNTER — Other Ambulatory Visit: Payer: Self-pay

## 2020-05-11 VITALS — Temp 97.2°F

## 2020-05-11 DIAGNOSIS — J310 Chronic rhinitis: Secondary | ICD-10-CM | POA: Diagnosis not present

## 2020-05-11 DIAGNOSIS — K219 Gastro-esophageal reflux disease without esophagitis: Secondary | ICD-10-CM | POA: Diagnosis not present

## 2020-05-11 NOTE — Progress Notes (Signed)
HPI: Adriana Romero is a 59 y.o. female who presents is referred by her PCP for evaluation of chronic coughing and chronic throat clearing.  She has had this for about 5 years now.  She has tried omeprazole in the past as well as allergy medications including Claritin Allegra and Zyrtec.  She also notices a bad smell in her nose.  She denies any colored mucus discharge from her nose.  No fever.  She has never seen an allergist. She does snore. She is presently on no PPI. Her cough is mostly nonproductive.  Past Medical History:  Diagnosis Date  . Abnormal heart rhythm   . BPPV (benign paroxysmal positional vertigo)   . Rosacea   . Seasonal allergies    Past Surgical History:  Procedure Laterality Date  . BREAST BIOPSY  2004  . BREAST EXCISIONAL BIOPSY Right 2004  . right ulner nerve transposition     2005  . TONSILLECTOMY     age 68  . VAGINAL HYSTERECTOMY  age 37   irregular bleeding  . WISDOM TOOTH EXTRACTION     Social History   Socioeconomic History  . Marital status: Married    Spouse name: Not on file  . Number of children: Not on file  . Years of education: Not on file  . Highest education level: Not on file  Occupational History  . Occupation: Horticulturist, commercial  Tobacco Use  . Smoking status: Never Smoker  Substance and Sexual Activity  . Alcohol use: Yes    Alcohol/week: 0.0 standard drinks    Comment: occassional  . Drug use: No  . Sexual activity: Yes    Comment: 1st intercourse 59 yo-More than 5 partners  Other Topics Concern  . Not on file  Social History Narrative  . Not on file   Social Determinants of Health   Financial Resource Strain:   . Difficulty of Paying Living Expenses:   Food Insecurity:   . Worried About Charity fundraiser in the Last Year:   . Arboriculturist in the Last Year:   Transportation Needs:   . Film/video editor (Medical):   Marland Kitchen Lack of Transportation (Non-Medical):   Physical Activity:   . Days of Exercise per Week:    . Minutes of Exercise per Session:   Stress:   . Feeling of Stress :   Social Connections:   . Frequency of Communication with Friends and Family:   . Frequency of Social Gatherings with Friends and Family:   . Attends Religious Services:   . Active Member of Clubs or Organizations:   . Attends Archivist Meetings:   Marland Kitchen Marital Status:    Family History  Problem Relation Age of Onset  . Emphysema Mother   . Heart disease Mother   . Allergies Brother   . Skin cancer Brother   . Asthma Other        nephew  . Allergies Other        nephew  . Clotting disorder Father   . Skin cancer Father   . Hypertension Father   . Skin cancer Sister   . Breast cancer Maternal Grandmother 67  . Cancer Paternal Aunt        Colon   Allergies  Allergen Reactions  . Sulfa Antibiotics     Has never been given this bc mother was allergic to it   Prior to Admission medications   Medication Sig Start Date End Date Taking? Authorizing  Provider  Azelaic Acid (FINACEA) 15 % FOAM Apply topically.   Yes [provider]  estradiol (VIVELLE-DOT) 0.075 MG/24HR Place 1 patch onto the skin 2 (two) times a week. 09/19/15  Yes Fontaine, Belinda Block, MD  Loratadine (CLARITIN PO) Take by mouth.   Yes [provider]     Positive ROS: Otherwise negative  All other systems have been reviewed and were otherwise negative with the exception of those mentioned in the HPI and as above.  Physical Exam: Constitutional: Alert, well-appearing, no acute distress.  She is chronically clearing her throat in the office today. Ears: External ears without lesions or tenderness. Ear canals are clear bilaterally with intact, clear TMs.  Nasal: External nose without lesions. Septum with mild deformity.  Moderate rhinitis with clear mucus discharge.  No polyps noted.  Both middle meatus regions were clear with no active mucopurulent discharge noted..  Minimal clear mucus within the nasal cavity. Oral:  Lips and gums without lesions. Tongue and palate mucosa without lesions. Posterior oropharynx clear.  Patient is status post tonsillectomy. Indirect laryngoscopy revealed a clear base of tongue vallecula and epiglottis.  Vocal cords were clear with normal vocal cord mobility and no significant supraglottic mucus noted. Neck: No palpable adenopathy or masses Respiratory: Breathing comfortably  Skin: No facial/neck lesions or rash noted.  Procedures  Assessment: Chronic throat clearing and cough I suspect is probably related to laryngeal pharyngeal reflux. She does have mild rhinitis.  Questionable sinus disease.  Plan: Placed on omeprazole 40 mg daily before dinner. Nasacort 2 sprays each nostril at night and saline rinse as needed nasal drainage. We will plan on obtaining a CT scan of her sinuses to evaluate for any chronic sinus condition. She will follow-up in 5 to 6 weeks for recheck.   Radene Journey, MD   CC:

## 2020-05-14 ENCOUNTER — Other Ambulatory Visit (INDEPENDENT_AMBULATORY_CARE_PROVIDER_SITE_OTHER): Payer: Self-pay

## 2020-05-14 DIAGNOSIS — J329 Chronic sinusitis, unspecified: Secondary | ICD-10-CM

## 2020-06-01 ENCOUNTER — Ambulatory Visit
Admission: RE | Admit: 2020-06-01 | Discharge: 2020-06-01 | Disposition: A | Discharge status: Home / Self Care | Payer: BC Managed Care – PPO | Source: Ambulatory Visit | Attending: Otolaryngology | Admitting: Otolaryngology

## 2020-06-01 DIAGNOSIS — J329 Chronic sinusitis, unspecified: Secondary | ICD-10-CM

## 2020-06-22 ENCOUNTER — Ambulatory Visit (INDEPENDENT_AMBULATORY_CARE_PROVIDER_SITE_OTHER): Payer: BC Managed Care – PPO | Admitting: Otolaryngology

## 2020-06-29 ENCOUNTER — Ambulatory Visit (INDEPENDENT_AMBULATORY_CARE_PROVIDER_SITE_OTHER): Payer: BC Managed Care – PPO | Admitting: Otolaryngology

## 2020-07-02 ENCOUNTER — Telehealth (INDEPENDENT_AMBULATORY_CARE_PROVIDER_SITE_OTHER): Payer: Self-pay | Admitting: Otolaryngology

## 2020-07-02 MED ORDER — AMOXICILLIN-POT CLAVULANATE 875-125 MG PO TABS: 1.0000 | ORAL_TABLET | Freq: Two times a day (BID) | ORAL | 0 refills | Status: DC

## 2020-07-02 NOTE — Telephone Encounter (Signed)
Called Adriana Romero today concerning results of her recent CT scan of her sinuses.  This showed chronic left maxillary sinus infection with air-fluid level in mucoperiosteal thickening.  She also has a septal spur to the left.  Remaining sinuses were fairly clear.  No infection on the right side.. She apparently has had chronic recurring sinus issues for a number of years but wants to avoid surgery. Discussed with her concerning taking antibiotics and will prescribe Augmentin 875 mg twice daily for 2weeks.  We will send this to [facility]. She will follow up here as needed any further sinus issues.

## 2020-07-12 ENCOUNTER — Other Ambulatory Visit (INDEPENDENT_AMBULATORY_CARE_PROVIDER_SITE_OTHER): Payer: Self-pay

## 2020-07-12 MED ORDER — OMEPRAZOLE 40 MG PO CPDR
40.0000 mg | DELAYED_RELEASE_CAPSULE | Freq: Every day | ORAL | 2 refills | Status: DC
Start: 2020-07-12 — End: 2022-02-17

## 2020-07-13 DIAGNOSIS — H34831 Tributary (branch) retinal vein occlusion, right eye, with macular edema: Secondary | ICD-10-CM | POA: Diagnosis not present

## 2020-08-23 DIAGNOSIS — H903 Sensorineural hearing loss, bilateral: Secondary | ICD-10-CM | POA: Diagnosis not present

## 2020-09-24 DIAGNOSIS — H34831 Tributary (branch) retinal vein occlusion, right eye, with macular edema: Secondary | ICD-10-CM | POA: Diagnosis not present

## 2020-09-24 DIAGNOSIS — H3561 Retinal hemorrhage, right eye: Secondary | ICD-10-CM | POA: Diagnosis not present

## 2020-09-24 DIAGNOSIS — H35033 Hypertensive retinopathy, bilateral: Secondary | ICD-10-CM | POA: Diagnosis not present

## 2020-09-24 DIAGNOSIS — H33103 Unspecified retinoschisis, bilateral: Secondary | ICD-10-CM | POA: Diagnosis not present

## 2020-09-27 DIAGNOSIS — Z0000 Encounter for general adult medical examination without abnormal findings: Secondary | ICD-10-CM | POA: Diagnosis not present

## 2020-09-27 DIAGNOSIS — Z13220 Encounter for screening for lipoid disorders: Secondary | ICD-10-CM | POA: Diagnosis not present

## 2020-09-27 DIAGNOSIS — Z23 Encounter for immunization: Secondary | ICD-10-CM | POA: Diagnosis not present

## 2020-09-27 DIAGNOSIS — M79672 Pain in left foot: Secondary | ICD-10-CM | POA: Diagnosis not present

## 2020-09-27 DIAGNOSIS — M5442 Lumbago with sciatica, left side: Secondary | ICD-10-CM | POA: Diagnosis not present

## 2020-09-28 DIAGNOSIS — L738 Other specified follicular disorders: Secondary | ICD-10-CM | POA: Diagnosis not present

## 2020-09-28 DIAGNOSIS — L570 Actinic keratosis: Secondary | ICD-10-CM | POA: Diagnosis not present

## 2020-09-28 DIAGNOSIS — L821 Other seborrheic keratosis: Secondary | ICD-10-CM | POA: Diagnosis not present

## 2020-09-28 DIAGNOSIS — L245 Irritant contact dermatitis due to other chemical products: Secondary | ICD-10-CM | POA: Diagnosis not present

## 2020-09-28 DIAGNOSIS — D225 Melanocytic nevi of trunk: Secondary | ICD-10-CM | POA: Diagnosis not present

## 2020-10-19 DIAGNOSIS — M5442 Lumbago with sciatica, left side: Secondary | ICD-10-CM | POA: Diagnosis not present

## 2020-11-02 DIAGNOSIS — M5442 Lumbago with sciatica, left side: Secondary | ICD-10-CM | POA: Diagnosis not present

## 2020-11-08 DIAGNOSIS — H34831 Tributary (branch) retinal vein occlusion, right eye, with macular edema: Secondary | ICD-10-CM | POA: Diagnosis not present

## 2020-11-09 DIAGNOSIS — M5442 Lumbago with sciatica, left side: Secondary | ICD-10-CM | POA: Diagnosis not present

## 2020-12-13 IMAGING — CT CT MAXILLOFACIAL W/O CM
3 of 5 series · 14 of 47 positions shown, 16 images · non-contrast
Comparison: None.

CLINICAL DATA: Chronic recurrent sinusitis

EXAM:
CT MAXILLOFACIAL WITHOUT CONTRAST
TECHNIQUE: Multidetector CT images of the paranasal sinuses were obtained using
the standard protocol without intravenous contrast.

[Series 4: sinus 2.00 hr60 s3 cor · coronal · 0.23mm/px · 3 of 91 slices shown]
[im 31/91  bone]
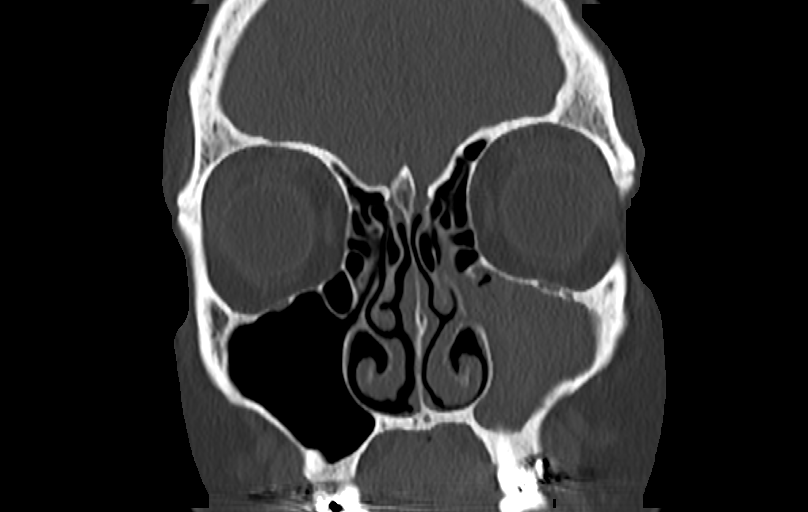
[im 41/91  bone]
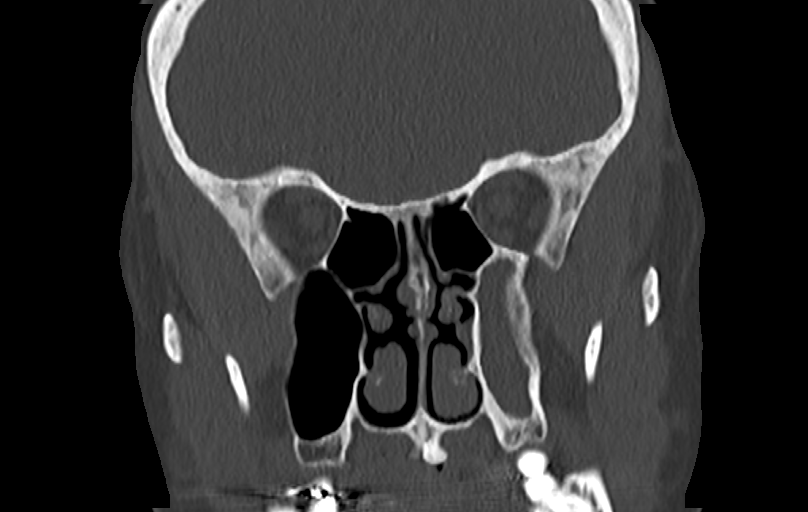
[im 51/91  bone]
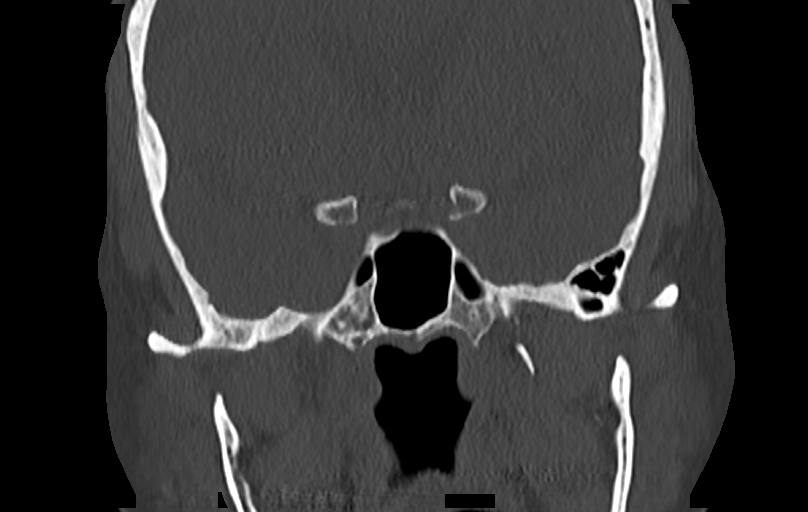

[Series 6: sinus 2.00 hr60 s3 sag · sagittal · 0.23mm/px · 3 of 91 slices shown]
[im 31/91  bone]
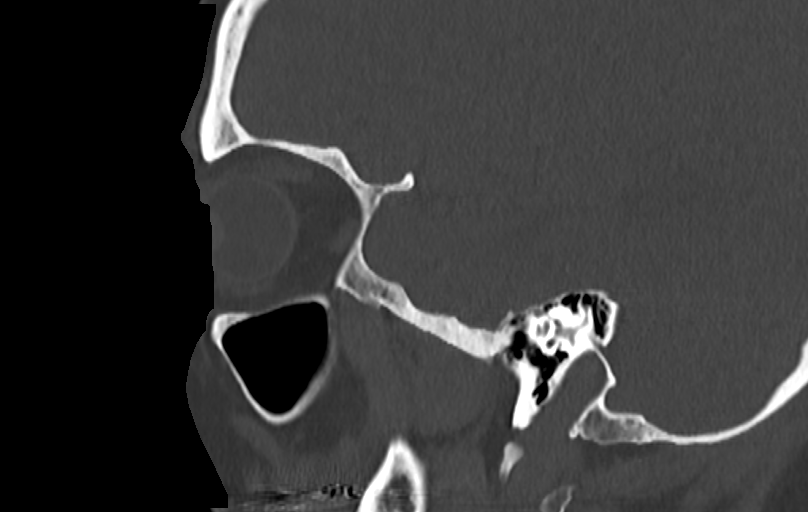
[im 46/91  bone]
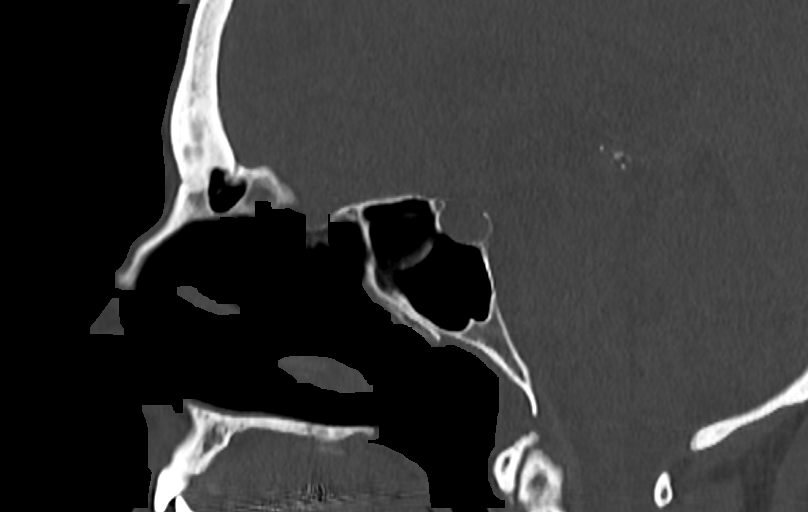
[im 61/91  bone]
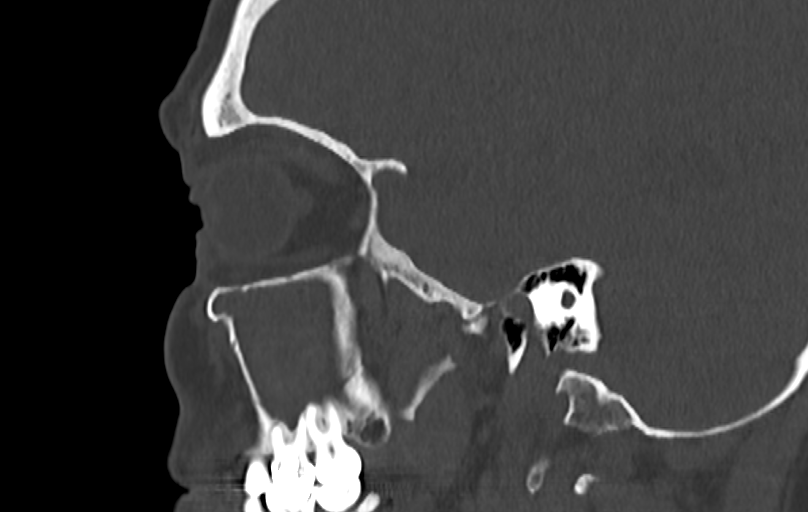

[Series 12: sinus 1.00 hr60 s3 axial fusion thins · axial · 0.36mm/px · z∈[-593,-503]mm · 8 of 193 slices shown, 10 images]
[im 22/193  brain]
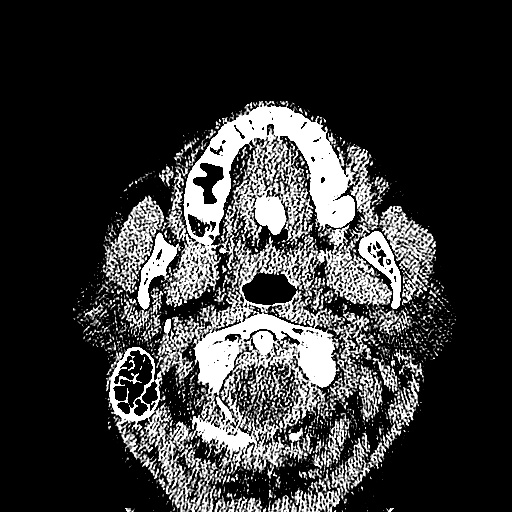
[im 22/193  bone]
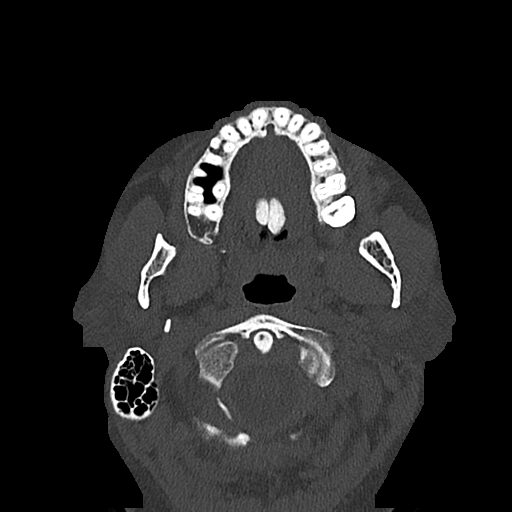
[im 43/193  bone]
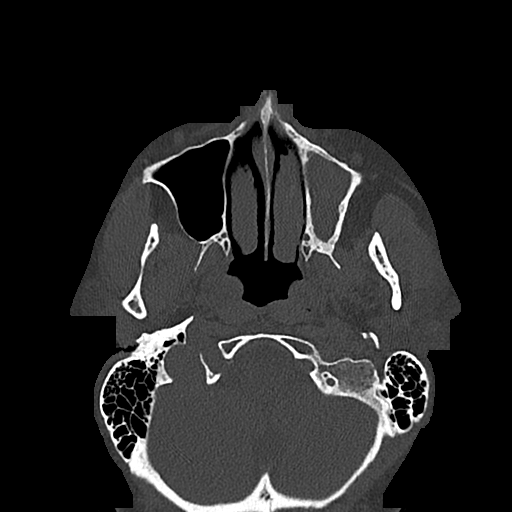
[im 65/193  bone]
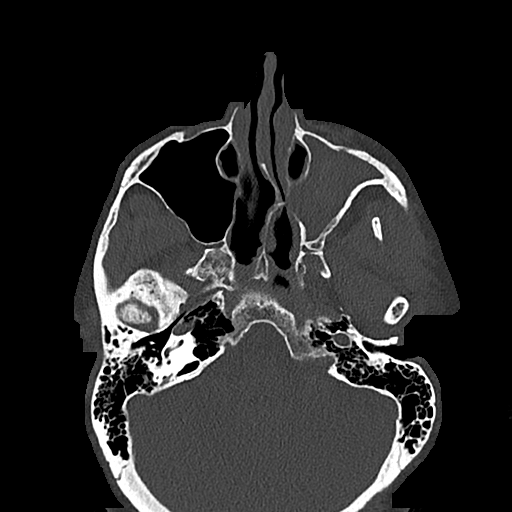
[im 86/193  bone]
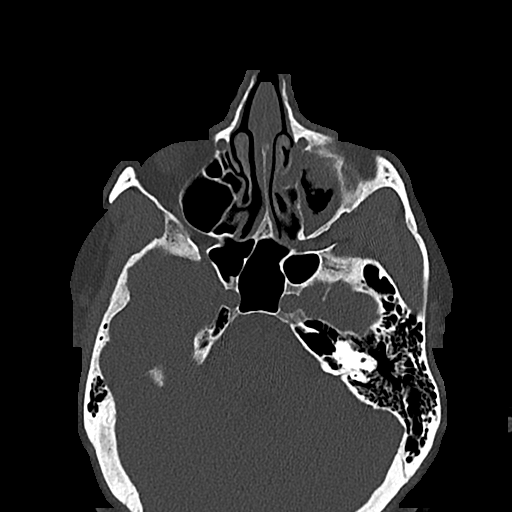
[im 107/193  brain]
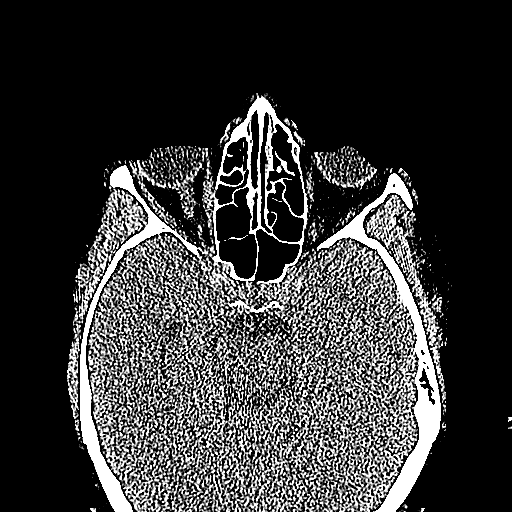
[im 107/193  bone]
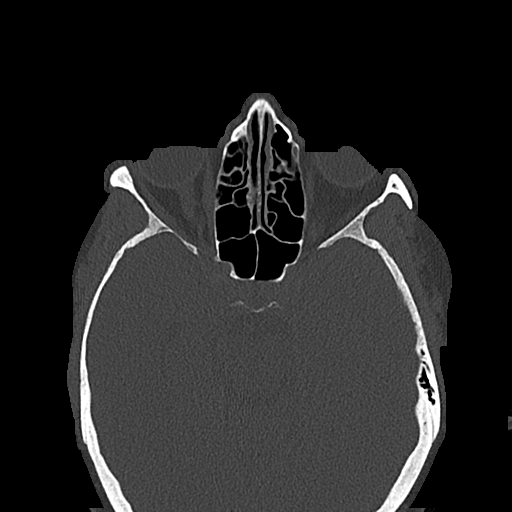
[im 129/193  bone]
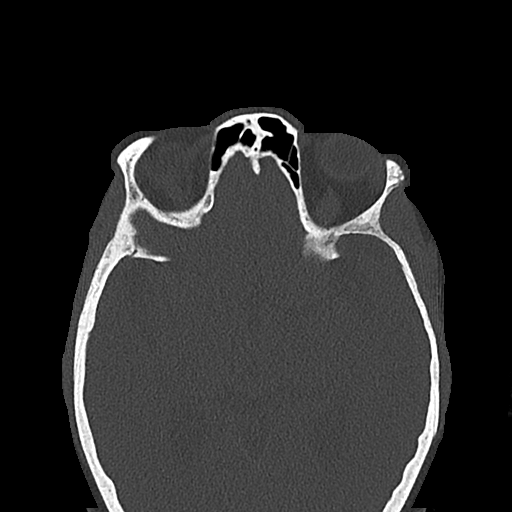
[im 150/193  bone]
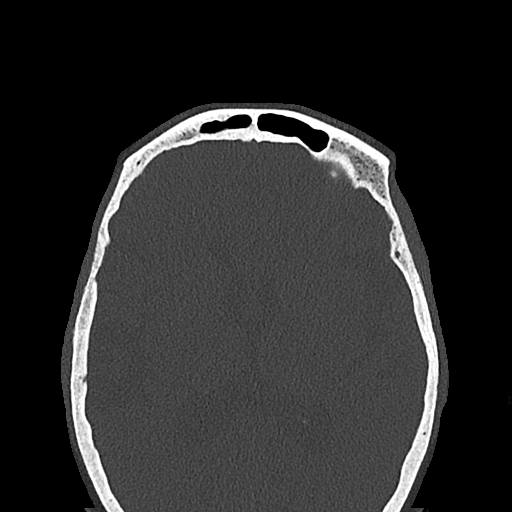
[im 171/193  bone]
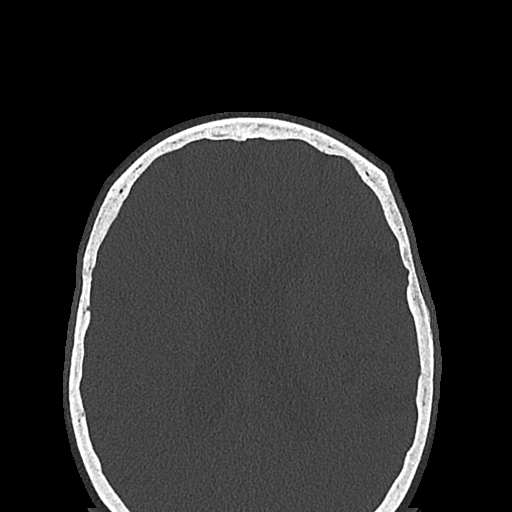

[14 of 47 positions shown; findings below may reference images not displayed]

FINDINGS: Paranasal sinuses:

Frontal: Normally aerated. Patent frontal sinus drainage pathways.

Ethmoid: Normally aerated.

Maxillary: Near complete opacification on the left with sclerotic
wall thickening from chronic inflammation

Sphenoid: Normally aerated. Patent sphenoethmoidal recesses.

Right ostiomeatal unit: Paradoxical turbinate rotation without
effect.

Left ostiomeatal unit: Soft tissue density completely effaces the
infundibulum. Partially effaced middle meatus due to turbinate
positioning against the uncinate which appears bowed medially.

Nasal passages: Patent. Intact nasal septum with leftward spur
reaching the upper aspect of the left inferior turbinate

Anatomy: No pneumatization superior to anterior ethmoid notches.
Sellar sphenoid pneumatization pattern. No dehiscence of carotid or
optic canals. No onodi cell.

Other: No incidental intracranial or soft tissue finding.
IMPRESSION: 1. Focal left maxillary sinusitis with near complete opacification
and infundibular obstruction.
2. Leftward septal spurring.

## 2021-01-11 DIAGNOSIS — H3500 Unspecified background retinopathy: Secondary | ICD-10-CM | POA: Diagnosis not present

## 2021-01-11 DIAGNOSIS — H2513 Age-related nuclear cataract, bilateral: Secondary | ICD-10-CM | POA: Diagnosis not present

## 2021-02-01 DIAGNOSIS — H3561 Retinal hemorrhage, right eye: Secondary | ICD-10-CM | POA: Diagnosis not present

## 2021-02-01 DIAGNOSIS — H25813 Combined forms of age-related cataract, bilateral: Secondary | ICD-10-CM | POA: Diagnosis not present

## 2021-02-01 DIAGNOSIS — H34831 Tributary (branch) retinal vein occlusion, right eye, with macular edema: Secondary | ICD-10-CM | POA: Diagnosis not present

## 2021-02-01 DIAGNOSIS — H3582 Retinal ischemia: Secondary | ICD-10-CM | POA: Diagnosis not present

## 2021-03-14 DIAGNOSIS — Z23 Encounter for immunization: Secondary | ICD-10-CM | POA: Diagnosis not present

## 2021-04-02 ENCOUNTER — Encounter: Payer: Self-pay | Admitting: Gastroenterology

## 2021-04-18 ENCOUNTER — Other Ambulatory Visit: Payer: Self-pay | Admitting: Family Medicine

## 2021-04-18 DIAGNOSIS — Z1231 Encounter for screening mammogram for malignant neoplasm of breast: Secondary | ICD-10-CM

## 2021-05-31 DIAGNOSIS — H5315 Visual distortions of shape and size: Secondary | ICD-10-CM | POA: Diagnosis not present

## 2021-05-31 DIAGNOSIS — H53411 Scotoma involving central area, right eye: Secondary | ICD-10-CM | POA: Diagnosis not present

## 2021-05-31 DIAGNOSIS — H35371 Puckering of macula, right eye: Secondary | ICD-10-CM | POA: Diagnosis not present

## 2021-05-31 DIAGNOSIS — H348312 Tributary (branch) retinal vein occlusion, right eye, stable: Secondary | ICD-10-CM | POA: Diagnosis not present

## 2021-06-07 ENCOUNTER — Ambulatory Visit (AMBULATORY_SURGERY_CENTER): Payer: BC Managed Care – PPO

## 2021-06-07 ENCOUNTER — Ambulatory Visit
Admission: RE | Admit: 2021-06-07 | Discharge: 2021-06-07 | Disposition: A | Discharge status: Home / Self Care | Payer: BC Managed Care – PPO | Source: Ambulatory Visit

## 2021-06-07 ENCOUNTER — Other Ambulatory Visit: Payer: Self-pay

## 2021-06-07 VITALS — Ht 66.0 in | Wt 165.0 lb

## 2021-06-07 DIAGNOSIS — Z1231 Encounter for screening mammogram for malignant neoplasm of breast: Secondary | ICD-10-CM

## 2021-06-07 DIAGNOSIS — Z1211 Encounter for screening for malignant neoplasm of colon: Secondary | ICD-10-CM

## 2021-06-07 NOTE — Progress Notes (Signed)
No egg or soy allergy known to patient  No issues with past sedation with any surgeries or procedures Patient denies ever being told they had issues or difficulty with intubation  No FH of Malignant Hyperthermia No diet pills per patient No home 02 use per patient  No blood thinners per patient  Pt denies issues with constipation  No A fib or A flutter  EMMI video to pt or via [place] 19 guidelines implemented in [place] today with Pt and RN  Pt is fully vaccinated  for Covid    VIRTUAL PREVISIT   Due to the COVID-19 pandemic we are asking patients to follow certain guidelines.  Pt aware of COVID protocols and [facility] guidelines

## 2021-06-11 DIAGNOSIS — H5340 Unspecified visual field defects: Secondary | ICD-10-CM | POA: Diagnosis not present

## 2021-06-11 DIAGNOSIS — H40033 Anatomical narrow angle, bilateral: Secondary | ICD-10-CM | POA: Diagnosis not present

## 2021-06-21 ENCOUNTER — Encounter: Payer: Self-pay | Admitting: Gastroenterology

## 2021-06-21 ENCOUNTER — Other Ambulatory Visit: Payer: Self-pay

## 2021-06-21 ENCOUNTER — Ambulatory Visit (AMBULATORY_SURGERY_CENTER): Payer: BC Managed Care – PPO | Admitting: Gastroenterology

## 2021-06-21 VITALS — BP 116/64 | HR 70 | Temp 97.3°F | Resp 15 | Ht 66.0 in | Wt 165.0 lb

## 2021-06-21 DIAGNOSIS — K5730 Diverticulosis of large intestine without perforation or abscess without bleeding: Secondary | ICD-10-CM | POA: Diagnosis not present

## 2021-06-21 DIAGNOSIS — K648 Other hemorrhoids: Secondary | ICD-10-CM | POA: Diagnosis not present

## 2021-06-21 DIAGNOSIS — K621 Rectal polyp: Secondary | ICD-10-CM | POA: Diagnosis not present

## 2021-06-21 DIAGNOSIS — D128 Benign neoplasm of rectum: Secondary | ICD-10-CM

## 2021-06-21 DIAGNOSIS — Z1211 Encounter for screening for malignant neoplasm of colon: Secondary | ICD-10-CM | POA: Diagnosis not present

## 2021-06-21 DIAGNOSIS — R194 Change in bowel habit: Secondary | ICD-10-CM

## 2021-06-21 MED ORDER — SODIUM CHLORIDE 0.9 % IV SOLN: 500.0000 mL | Freq: Once | INTRAVENOUS | Status: DC

## 2021-06-21 NOTE — Patient Instructions (Signed)
Impression/Recommendations:  Polyp, diverticulosis, high fiber diet, and hemorrhoid handouts given to patient.  Resume previous diet. Continue present medications. Await pathology results.  Repeat colonoscopy date to be determined after pathology results reviewed.  YOU HAD AN ENDOSCOPIC PROCEDURE TODAY AT [facility]:   Refer to the procedure report that was given to you for any specific questions about what was found during the examination.  If the procedure report does not answer your questions, please call your gastroenterologist to clarify.  If you requested that your care partner not be given the details of your procedure findings, then the procedure report has been included in a sealed envelope for you to review at your convenience later.  YOU SHOULD EXPECT: Some feelings of bloating in the abdomen. Passage of more gas than usual.  Walking can help get rid of the air that was put into your GI tract during the procedure and reduce the bloating. If you had a lower endoscopy (such as a colonoscopy or flexible sigmoidoscopy) you may notice spotting of blood in your stool or on the toilet paper. If you underwent a bowel prep for your procedure, you may not have a normal bowel movement for a few days.  Please Note:  You might notice some irritation and congestion in your nose or some drainage.  This is from the oxygen used during your procedure.  There is no need for concern and it should clear up in a day or so.  SYMPTOMS TO REPORT IMMEDIATELY:  Following lower endoscopy (colonoscopy or flexible sigmoidoscopy):  Excessive amounts of blood in the stool  Significant tenderness or worsening of abdominal pains  Swelling of the abdomen that is new, acute  Fever of 100F or higher  For urgent or emergent issues, a gastroenterologist can be reached at any hour by calling [phone]. Do not use MyChart messaging for urgent concerns.    DIET:  We do recommend a small meal at  first, but then you may proceed to your regular diet.  Drink plenty of fluids but you should avoid alcoholic beverages for 24 hours.  ACTIVITY:  You should plan to take it easy for the rest of today and you should NOT DRIVE or use heavy machinery until tomorrow (because of the sedation medicines used during the test).    FOLLOW UP: Our staff will call the number listed on your records 48-72 hours following your procedure to check on you and address any questions or concerns that you may have regarding the information given to you following your procedure. If we do not reach you, we will leave a message.  We will attempt to reach you two times.  During this call, we will ask if you have developed any symptoms of COVID 19. If you develop any symptoms (ie: fever, flu-like symptoms, shortness of breath, cough etc.) before then, please call [phone].  If you test positive for Covid 19 in the 2 weeks post procedure, please call and report this information to [place].    If any biopsies were taken you will be contacted by phone or by letter within the next 1-3 weeks.  Please call us at [phone] if you have not heard about the biopsies in 3 weeks.    SIGNATURES/CONFIDENTIALITY: You and/or your care partner have signed paperwork which will be entered into your electronic medical record.  These signatures attest to the fact that that the information above on your After Visit Summary has been reviewed and is understood.  Full responsibility of the confidentiality of this discharge information lies with you and/or your care-partner.  

## 2021-06-21 NOTE — Op Note (Signed)
[facility] Patient Name: Taiyana Kissler Procedure Date: 06/21/2021 9:01 AM MRN: [id number] Endoscopist: Thornton Park MD, MD Age: 60 Referring MD:  Date of Birth: 03-22-1961 Gender: Female Account #: [id number] Procedure:                Colonoscopy Indications:              Screening for colorectal malignant neoplasm                           Colonoscopy at [facility] >10 years ago (results not                            available to me), patient reports polpys were                            removed and surveillance recommended in 5 years                           Family history of colon polyps (father) and colon                            cancer (paternal aunt in her 47s)                           Incidental history: altered bowel habits for 6                            months Medicines:                Monitored Anesthesia Care Procedure:                Pre-Anesthesia Assessment:                           - Prior to the procedure, a History and Physical                            was performed, and patient medications and                            allergies were reviewed. The patient's tolerance of                            previous anesthesia was also reviewed. The risks                            and benefits of the procedure and the sedation                            options and risks were discussed with the patient.                            All questions were answered, and informed consent  was obtained. Prior Anticoagulants: The patient has                            taken no previous anticoagulant or antiplatelet                            agents. ASA Grade Assessment: II - A patient with                            mild systemic disease. After reviewing the risks                            and benefits, the patient was deemed in                            satisfactory condition to undergo the procedure.                           After  obtaining informed consent, the colonoscope                            was passed under direct vision. Throughout the                            procedure, the patient's blood pressure, pulse, and                            oxygen saturations were monitored continuously. The                            CF [id number] #[id number] was introduced through the anus                            and advanced to the 3 cm into the ileum. A second                            forward view of the right colon was performed. The                            colonoscopy was performed without difficulty. The                            patient tolerated the procedure well. The quality                            of the bowel preparation was good. The terminal                            ileum, ileocecal valve, appendiceal orifice, and                            rectum were photographed. Scope In: 9:08:04 AM Scope Out: 9:22:18 AM Scope Withdrawal Time: 0 hours 11 minutes  29 seconds  Total Procedure Duration: 0 hours 14 minutes 14 seconds  Findings:                 The perianal and digital rectal examinations were                            normal.                           Non-bleeding internal hemorrhoids were found.                           Multiple small and large-mouthed diverticula were                            found in the sigmoid colon and descending colon.                           A 4 mm polyp was found in the proximal rectum. The                            polyp was sessile. The polyp was removed with a                            cold snare. Resection and retrieval were complete.                            Estimated blood loss was minimal.                           The exam was otherwise without abnormality on                            direct and retroflexion views. Complications:            No immediate complications. Estimated blood loss:                            Minimal. Estimated Blood Loss:      Estimated blood loss was minimal. Impression:               - Non-bleeding internal hemorrhoids.                           - Diverticulosis in the sigmoid colon and in the                            descending colon.                           - One 4 mm polyp in the proximal rectum, removed                            with a cold snare. Resected and retrieved.                           -  The examination was otherwise normal on direct                            and retroflexion views. Recommendation:           - Patient has a contact number available for                            emergencies. The signs and symptoms of potential                            delayed complications were discussed with the                            patient. Return to normal activities tomorrow.                            Written discharge instructions were provided to the                            patient.                           - Resume previous diet.                           - Continue present medications.                           - Await pathology results.                           - Repeat colonoscopy date to be determined after                            pending pathology results are reviewed for                            surveillance.                           - Emerging evidence supports eating a diet of                            fruits, vegetables, grains, calcium, and yogurt                            while reducing red meat and alcohol may reduce the                            risk of colon cancer.                           - Follow a high fiber diet. Drink at least 64  ounces of water daily. Add a daily stool bulking                            agent such as psyllium (an exampled would be                            Metamucil).                           - Thank you for allowing me to be involved in your                            colon cancer prevention. Thornton Park MD, MD 06/21/2021 9:27:14 AM This report has been signed electronically.

## 2021-06-21 NOTE — Progress Notes (Signed)
A and O x3. Report to RN. Tolerated MAC anesthesia well. 

## 2021-06-21 NOTE — Progress Notes (Signed)
VS by CW  Pt's states no medical or surgical changes since previsit or office visit.  

## 2021-06-26 ENCOUNTER — Telehealth: Payer: Self-pay

## 2021-06-26 NOTE — Telephone Encounter (Signed)
  Follow up Call-  Call back number 06/21/2021  Post procedure Call Back phone  # [phone]  Permission to leave phone message Yes  Some recent data might be hidden     Patient questions:  Do you have a fever, pain , or abdominal swelling? No. Pain Score  0 *  Have you tolerated food without any problems? Yes.    Have you been able to return to your normal activities? Yes.    Do you have any questions about your discharge instructions: Diet   No. Medications  No. Follow up visit  No.  Do you have questions or concerns about your Care? No.  Actions: * If pain score is 4 or above: No action needed, pain <4. Have you developed a fever since your procedure? no  2.   Have you had an respiratory symptoms (SOB or cough) since your procedure? no  3.   Have you tested positive for COVID 19 since your procedure no  4.   Have you had any family members/close contacts diagnosed with the COVID 19 since your procedure?  no   If yes to any of these questions please route to Joylene John, RN and Joella Prince, RN

## 2021-06-28 ENCOUNTER — Encounter: Payer: Self-pay | Admitting: Gastroenterology

## 2021-08-05 DIAGNOSIS — H40051 Ocular hypertension, right eye: Secondary | ICD-10-CM | POA: Diagnosis not present

## 2021-08-23 DIAGNOSIS — Z0000 Encounter for general adult medical examination without abnormal findings: Secondary | ICD-10-CM | POA: Diagnosis not present

## 2021-08-23 DIAGNOSIS — Z13220 Encounter for screening for lipoid disorders: Secondary | ICD-10-CM | POA: Diagnosis not present

## 2021-08-23 DIAGNOSIS — Z23 Encounter for immunization: Secondary | ICD-10-CM | POA: Diagnosis not present

## 2021-08-23 DIAGNOSIS — G4700 Insomnia, unspecified: Secondary | ICD-10-CM | POA: Diagnosis not present

## 2021-09-10 DIAGNOSIS — J019 Acute sinusitis, unspecified: Secondary | ICD-10-CM | POA: Diagnosis not present

## 2021-09-26 DIAGNOSIS — J019 Acute sinusitis, unspecified: Secondary | ICD-10-CM | POA: Diagnosis not present

## 2021-09-26 DIAGNOSIS — R059 Cough, unspecified: Secondary | ICD-10-CM | POA: Diagnosis not present

## 2021-10-04 DIAGNOSIS — H40033 Anatomical narrow angle, bilateral: Secondary | ICD-10-CM | POA: Diagnosis not present

## 2021-10-04 DIAGNOSIS — H401111 Primary open-angle glaucoma, right eye, mild stage: Secondary | ICD-10-CM | POA: Diagnosis not present

## 2021-10-04 DIAGNOSIS — H35371 Puckering of macula, right eye: Secondary | ICD-10-CM | POA: Diagnosis not present

## 2021-10-04 DIAGNOSIS — H35033 Hypertensive retinopathy, bilateral: Secondary | ICD-10-CM | POA: Diagnosis not present

## 2021-10-04 DIAGNOSIS — H348312 Tributary (branch) retinal vein occlusion, right eye, stable: Secondary | ICD-10-CM | POA: Diagnosis not present

## 2021-11-08 DIAGNOSIS — J3489 Other specified disorders of nose and nasal sinuses: Secondary | ICD-10-CM | POA: Diagnosis not present

## 2021-11-08 DIAGNOSIS — H61303 Acquired stenosis of external ear canal, unspecified, bilateral: Secondary | ICD-10-CM | POA: Diagnosis not present

## 2021-11-08 DIAGNOSIS — H6123 Impacted cerumen, bilateral: Secondary | ICD-10-CM | POA: Diagnosis not present

## 2021-11-08 DIAGNOSIS — H9193 Unspecified hearing loss, bilateral: Secondary | ICD-10-CM | POA: Diagnosis not present

## 2021-12-19 DIAGNOSIS — J3489 Other specified disorders of nose and nasal sinuses: Secondary | ICD-10-CM | POA: Diagnosis not present

## 2021-12-19 DIAGNOSIS — J329 Chronic sinusitis, unspecified: Secondary | ICD-10-CM | POA: Diagnosis not present

## 2021-12-19 DIAGNOSIS — J310 Chronic rhinitis: Secondary | ICD-10-CM | POA: Diagnosis not present

## 2021-12-19 DIAGNOSIS — J342 Deviated nasal septum: Secondary | ICD-10-CM | POA: Diagnosis not present

## 2021-12-19 DIAGNOSIS — J320 Chronic maxillary sinusitis: Secondary | ICD-10-CM | POA: Diagnosis not present

## 2022-01-10 DIAGNOSIS — R0981 Nasal congestion: Secondary | ICD-10-CM | POA: Diagnosis not present

## 2022-01-10 DIAGNOSIS — J328 Other chronic sinusitis: Secondary | ICD-10-CM | POA: Diagnosis not present

## 2022-01-10 DIAGNOSIS — J342 Deviated nasal septum: Secondary | ICD-10-CM | POA: Diagnosis not present

## 2022-01-10 DIAGNOSIS — J343 Hypertrophy of nasal turbinates: Secondary | ICD-10-CM | POA: Diagnosis not present

## 2022-01-15 DIAGNOSIS — H2513 Age-related nuclear cataract, bilateral: Secondary | ICD-10-CM | POA: Diagnosis not present

## 2022-01-15 DIAGNOSIS — H5203 Hypermetropia, bilateral: Secondary | ICD-10-CM | POA: Diagnosis not present

## 2022-01-15 DIAGNOSIS — H401131 Primary open-angle glaucoma, bilateral, mild stage: Secondary | ICD-10-CM | POA: Diagnosis not present

## 2022-01-24 DIAGNOSIS — R5383 Other fatigue: Secondary | ICD-10-CM | POA: Diagnosis not present

## 2022-01-24 DIAGNOSIS — Z01818 Encounter for other preprocedural examination: Secondary | ICD-10-CM | POA: Diagnosis not present

## 2022-01-24 DIAGNOSIS — R053 Chronic cough: Secondary | ICD-10-CM | POA: Diagnosis not present

## 2022-01-24 DIAGNOSIS — N3281 Overactive bladder: Secondary | ICD-10-CM | POA: Diagnosis not present

## 2022-02-05 DIAGNOSIS — J320 Chronic maxillary sinusitis: Secondary | ICD-10-CM | POA: Diagnosis not present

## 2022-02-05 DIAGNOSIS — J328 Other chronic sinusitis: Secondary | ICD-10-CM | POA: Diagnosis not present

## 2022-02-05 DIAGNOSIS — J342 Deviated nasal septum: Secondary | ICD-10-CM | POA: Diagnosis not present

## 2022-02-05 DIAGNOSIS — R053 Chronic cough: Secondary | ICD-10-CM | POA: Diagnosis not present

## 2022-02-05 DIAGNOSIS — J329 Chronic sinusitis, unspecified: Secondary | ICD-10-CM | POA: Diagnosis not present

## 2022-02-05 DIAGNOSIS — R0981 Nasal congestion: Secondary | ICD-10-CM | POA: Diagnosis not present

## 2022-02-05 DIAGNOSIS — J322 Chronic ethmoidal sinusitis: Secondary | ICD-10-CM | POA: Diagnosis not present

## 2022-02-05 DIAGNOSIS — J343 Hypertrophy of nasal turbinates: Secondary | ICD-10-CM | POA: Diagnosis not present

## 2022-02-06 DIAGNOSIS — Z09 Encounter for follow-up examination after completed treatment for conditions other than malignant neoplasm: Secondary | ICD-10-CM | POA: Diagnosis not present

## 2022-02-14 DIAGNOSIS — J329 Chronic sinusitis, unspecified: Secondary | ICD-10-CM | POA: Diagnosis not present

## 2022-02-17 ENCOUNTER — Ambulatory Visit (INDEPENDENT_AMBULATORY_CARE_PROVIDER_SITE_OTHER)
Admission: RE | Admit: 2022-02-17 | Discharge: 2022-02-17 | Disposition: A | Discharge status: Home / Self Care | Payer: BC Managed Care – PPO | Source: Ambulatory Visit | Attending: Pulmonary Disease | Admitting: Pulmonary Disease

## 2022-02-17 ENCOUNTER — Encounter: Payer: Self-pay | Admitting: Pulmonary Disease

## 2022-02-17 ENCOUNTER — Other Ambulatory Visit: Payer: Self-pay | Admitting: Pulmonary Disease

## 2022-02-17 ENCOUNTER — Other Ambulatory Visit: Payer: Self-pay

## 2022-02-17 ENCOUNTER — Ambulatory Visit: Payer: BC Managed Care – PPO | Admitting: Pulmonary Disease

## 2022-02-17 ENCOUNTER — Telehealth: Payer: Self-pay | Admitting: Pulmonary Disease

## 2022-02-17 VITALS — BP 132/72 | HR 72 | Temp 98.1°F | Ht 65.0 in | Wt 165.0 lb

## 2022-02-17 DIAGNOSIS — R053 Chronic cough: Secondary | ICD-10-CM

## 2022-02-17 DIAGNOSIS — R059 Cough, unspecified: Secondary | ICD-10-CM | POA: Diagnosis not present

## 2022-02-17 LAB — CBC WITH DIFFERENTIAL/PLATELET
Basophils Absolute: 0 10*3/uL (ref 0.0–0.1)
Basophils Relative: 0.3 % (ref 0.0–3.0)
Eosinophils Absolute: 0.2 10*3/uL (ref 0.0–0.7)
Eosinophils Relative: 2.4 % (ref 0.0–5.0)
HCT: 37.9 % (ref 36.0–46.0)
Hemoglobin: 12.5 g/dL (ref 12.0–15.0)
Lymphocytes Relative: 18.9 % (ref 12.0–46.0)
Lymphs Abs: 1.5 10*3/uL (ref 0.7–4.0)
MCHC: 32.9 g/dL (ref 30.0–36.0)
MCV: 93.5 fl (ref 78.0–100.0)
Monocytes Absolute: 0.4 10*3/uL (ref 0.1–1.0)
Monocytes Relative: 4.7 % (ref 3.0–12.0)
Neutro Abs: 5.9 10*3/uL (ref 1.4–7.7)
Neutrophils Relative %: 73.7 % (ref 43.0–77.0)
Platelets: 316 10*3/uL (ref 150.0–400.0)
RBC: 4.05 Mil/uL (ref 3.87–5.11)
RDW: 13.9 % (ref 11.5–15.5)
WBC: 8 10*3/uL (ref 4.0–10.5)

## 2022-02-17 MED ORDER — BUDESONIDE-FORMOTEROL FUMARATE 160-4.5 MCG/ACT IN AERO: 2.0000 | INHALATION_SPRAY | Freq: Two times a day (BID) | RESPIRATORY_TRACT | 5 refills | Status: DC

## 2022-02-17 NOTE — Telephone Encounter (Signed)
I called and cancelled the prescription at [facility] and it was sent to [facility] on [address] in [place] per patient request. Nothing further needed.

## 2022-02-17 NOTE — Patient Instructions (Signed)
We will get some labs today including CBC with differential, IgE and chest x-ray Start Symbicort 160.  Take 2 puffs in the morning and evening Schedule PFTs and follow-up in clinic after PFTs

## 2022-02-17 NOTE — Progress Notes (Signed)
Adriana Romero    [id number]    1961/09/04  Primary Care Physician:Wharton, Myrtha Mantis  Referring Physician: Marda Stalker, [place],  [place] [address]  Chief complaint: Consult for chronic cough, dyspnea  HPI: 61 year old with history of allergies, chronic sinus infections Complains of chronic cough for over 10 years.  She was previously evaluated by Dr. Olin Pia in 2014 for upper airway cough which was treated with antiacid medication and treatment of postnasal drip.  There is temporary improvement but symptoms returned.  She has symptoms which are worsened by speaking, change in temperature Has persistent issues with chronic maxillary sinusitis and underwent sinus surgery, septoplasty by Dr. Gaylyn Cheers at [facility] on February 05, 2022  She has seasonal allergies, denies acid reflux.  Had been on PPI in the past without any improvement  Pets: No pets Occupation: Works as a Research officer, trade union Exposures: No mold, hot tub, Customer service manager.  No feather pillows or comforter Smoking history: Never smoker.  Had been exposed to secondhand Travel history: From [place].  No significant recent travel Relevant family history: Mom had emphysema   Outpatient Encounter Medications as of 02/17/2022  Medication Sig   acetaminophen (TYLENOL) 325 MG tablet Take 650 mg by mouth every 6 (six) hours as needed.   albuterol (VENTOLIN HFA) 108 (90 Base) MCG/ACT inhaler 1 puff as needed   bacitracin 500 UNIT/GM ointment Apply 1 application topically 2 (two) times daily.   Collagen Hydrolysate, Bovine, POWD by Does not apply route 3 (three) times a week.   Fiber POWD Take by mouth 3 (three) times a week.   oxybutynin (DITROPAN-XL) 10 MG 24 hr tablet Take 10 mg by mouth daily.   sodium chloride (OCEAN) 0.65 % SOLN nasal spray Place 1 spray into both nostrils as needed for congestion.   timolol (TIMOPTIC) 0.5 % ophthalmic solution Place 1 drop into the right eye every morning.    amoxicillin-clavulanate (AUGMENTIN) 875-125 MG tablet Take 1 tablet by mouth 2 (two) times daily.   Azelaic Acid 15 % FOAM Apply topically.   estradiol (VIVELLE-DOT) 0.075 MG/24HR Place 1 patch onto the skin 2 (two) times a week.   Loratadine (CLARITIN PO) Take by mouth.   omeprazole (PRILOSEC) 40 MG capsule Take 1 capsule (40 mg total) by mouth daily.   No facility-administered encounter medications on file as of 02/17/2022.    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   Rosacea    Seasonal allergies     Past Surgical History:  Procedure Laterality Date   BREAST BIOPSY  2004   BREAST EXCISIONAL BIOPSY Right 2004   COLONOSCOPY  2012   right ulner nerve transposition     2005   TONSILLECTOMY     age 75   VAGINAL HYSTERECTOMY  age 64   irregular bleeding   WISDOM TOOTH EXTRACTION      Family History  Problem Relation Age of Onset   Emphysema Mother    Heart disease Mother    Colon polyps Father    Clotting disorder Father    Skin cancer  Father    Hypertension Father    Skin cancer Sister    Allergies Brother    Skin cancer Brother    Cancer Paternal Aunt        Colon   Breast cancer Maternal Grandmother 63   Asthma Other        nephew   Allergies Other        nephew   Colon cancer Neg Hx    Esophageal cancer Neg Hx    Rectal cancer Neg Hx    Stomach cancer Neg Hx     Social History   Socioeconomic History   Marital status: Married    Spouse name: Not on file   Number of children: Not on file   Years of education: Not on file   Highest education level: Not on file  Occupational History   Occupation: Museum/gallery curator admin  Tobacco  Use   Smoking status: Never    Passive exposure: Past (Mother and grandparents as a child)   Smokeless tobacco: Never  Vaping Use   Vaping Use: Never used  Substance and Sexual Activity   Alcohol use: Not Currently   Drug use: No   Sexual activity: Yes    Comment: 1st intercourse 61 yo-More than 5 partners  Other Topics Concern   Not on file  Social History Narrative   Not on file   Social Determinants of Health   Financial Resource Strain: Not on file  Food Insecurity: Not on file  Transportation Needs: Not on file  Physical Activity: Not on file  Stress: Not on file  Social Connections: Not on file  Intimate Partner Violence: Not on file    Review of systems: Review of Systems  Constitutional: Negative for fever and chills.  HENT: Negative.   Eyes: Negative for blurred vision.  Respiratory: as per HPI  Cardiovascular: Negative for chest pain and palpitations.  Gastrointestinal: Negative for vomiting, diarrhea, blood per rectum. Genitourinary: Negative for dysuria, urgency, frequency and hematuria.  Musculoskeletal: Negative for myalgias, back pain and joint pain.  Skin: Negative for itching and rash.  Neurological: Negative for dizziness, tremors, focal weakness, seizures and loss of consciousness.  Endo/Heme/Allergies: Negative for environmental allergies.  Psychiatric/Behavioral: Negative for depression, suicidal ideas and hallucinations.  All other systems reviewed and are negative.  Physical Exam: Blood pressure 132/72, pulse 72, temperature 98.1 F (36.7 C), temperature source Oral, height 5\' 5"  (1.651 m), weight 165 lb (74.8 kg), SpO2 99 %. Gen:      No acute distress HEENT:  EOMI, sclera anicteric Neck:     No masses; no thyromegaly Lungs:    Clear to auscultation bilaterally; normal respiratory effort CV:         Regular rate and rhythm; no murmurs Abd:      + bowel sounds; soft, non-tender; no palpable masses, no distension Ext:    No edema; adequate  peripheral perfusion Skin:      Warm and dry; no rash Neuro: alert and oriented x 3 Psych: normal mood and affect  Data Reviewed: Imaging: CT sinuses 06/01/2020-focal left maxillary sinusitis with complete obstruction, leftward septal spurring. I reviewed the image personally.  PFTs: Spirometry 08/25/2013 FVC 3.4 [97%], FEV1 2.6 [93%], F/F 77 Normal spirometry  Labs:  Assessment:  Chronic cough She has been treated extensively on the past for post nasal drip and GERD without improvement.  Not interested in retrying therapies as they have been ineffective in the past Recently underwent sinus surgery for chronic sinusitis May  have reactive airway disease based on presence of seasonal allergies Check CBC differential, IgE and chest x-ray today Try Symbicort and schedule PFTs  Plan/Recommendations: Labs, chest x-ray, PFTs Symbicort   Marshell Garfinkel MD [facility] 02/17/2022, 9:41 AM  CC: Marda Stalker, PA-C

## 2022-02-18 ENCOUNTER — Other Ambulatory Visit (HOSPITAL_COMMUNITY): Payer: Self-pay

## 2022-02-18 ENCOUNTER — Telehealth: Payer: Self-pay | Admitting: Pharmacy Technician

## 2022-02-18 ENCOUNTER — Telehealth: Payer: Self-pay | Admitting: Pulmonary Disease

## 2022-02-18 LAB — IGE: IgE (Immunoglobulin E), Serum: 35 kU/L (ref <=114)

## 2022-02-18 NOTE — Telephone Encounter (Signed)
PA team, please advise. Thanks ?

## 2022-02-18 NOTE — Telephone Encounter (Signed)
PA has been submitted.

## 2022-02-18 NOTE — Telephone Encounter (Signed)
Patient Advocate Encounter  Received notification from [place] that prior authorization for SYMBICORT 160MCG is required.   PA submitted on 2.28.23 [facility] Status is pending   [facility] will continue to follow  Luciano Cutter, CPhT Patient Advocate Phone: [phone] Fax:  [phone]

## 2022-02-19 ENCOUNTER — Telehealth: Payer: Self-pay | Admitting: Pulmonary Disease

## 2022-02-19 NOTE — Telephone Encounter (Signed)
Routing message from pt to prior auth team. Please advise if you have any additional info about the PA and info from that if the PA for the Symbicort was denied. ?

## 2022-02-20 ENCOUNTER — Other Ambulatory Visit (HOSPITAL_COMMUNITY): Payer: Self-pay

## 2022-02-20 NOTE — Telephone Encounter (Signed)
That was the initial reason, however, after speaking with the insurance, pt does not meet the criteria for Symbicort. Official denial letter will be sent to the patient and provider. Some of those reasons were, pt does not have diagnosis for Asthma, COPD, bronchitis, or post infection cough. Official denial letter will be faxed and we will upload to patient chart.

## 2022-02-20 NOTE — Telephone Encounter (Signed)
Routing this info to Dr. Vaughan Browner for him to review. ?

## 2022-02-22 ENCOUNTER — Encounter: Payer: Self-pay | Admitting: Pulmonary Disease

## 2022-02-24 ENCOUNTER — Encounter: Payer: Self-pay | Admitting: Pulmonary Disease

## 2022-02-24 DIAGNOSIS — J329 Chronic sinusitis, unspecified: Secondary | ICD-10-CM | POA: Diagnosis not present

## 2022-02-24 NOTE — Telephone Encounter (Signed)
Pharmacy team, pt says [employer] [facility] is saying her symbicort requires step therapy. Is it not covered? If not, what alternative is covered? Thanks.  ?

## 2022-02-24 NOTE — Telephone Encounter (Signed)
See other pt email from 02/24/22.  ?

## 2022-02-25 ENCOUNTER — Other Ambulatory Visit (HOSPITAL_COMMUNITY): Payer: Self-pay

## 2022-02-25 NOTE — Telephone Encounter (Signed)
Just to clarify, pt only needs to try and fail one of these inhalers? (Advair, [place]) ?

## 2022-02-26 ENCOUNTER — Other Ambulatory Visit (HOSPITAL_COMMUNITY): Payer: Self-pay

## 2022-02-26 NOTE — Telephone Encounter (Addendum)
Please run test claims for Breo, generic Advair, Sheldon Silvan. If all require prior authorizations, AirDuo is $41.56 through [facility] at [employer] and $43.14 at [facility] through [facility]. ? ?Knox Saliva, PharmD, MPH, BCPS ?Clinical Pharmacist (Rheumatology and Pulmonology) ?

## 2022-02-26 NOTE — Telephone Encounter (Signed)
The pt states the Symbicort IS covered. You mentioned in the last note that Symbicort was part of step therapy. Is the Symbicort covered? ?

## 2022-02-27 ENCOUNTER — Other Ambulatory Visit (HOSPITAL_COMMUNITY): Payer: Self-pay

## 2022-02-27 ENCOUNTER — Telehealth: Payer: Self-pay | Admitting: Pulmonary Disease

## 2022-02-28 ENCOUNTER — Other Ambulatory Visit (HOSPITAL_COMMUNITY): Payer: Self-pay

## 2022-02-28 NOTE — Telephone Encounter (Signed)
Noted. Will close encounter since there is another encounter open about the Symbicort.  ?

## 2022-03-03 ENCOUNTER — Other Ambulatory Visit (HOSPITAL_COMMUNITY): Payer: Self-pay

## 2022-03-03 ENCOUNTER — Encounter: Payer: Self-pay | Admitting: Pulmonary Disease

## 2022-03-03 NOTE — Telephone Encounter (Signed)
Devki, Please advise on which inhaler is $41-43 with Goodrx? ?

## 2022-03-03 NOTE — Telephone Encounter (Signed)
Dr. Vaughan Browner, ?The PA for the Symbicort is pending.  Do you want to do anything differently until we get the outcome?  The patient is asking.  Thank you. ?

## 2022-03-04 ENCOUNTER — Other Ambulatory Visit (HOSPITAL_COMMUNITY): Payer: Self-pay

## 2022-03-04 NOTE — Telephone Encounter (Signed)
Dr. Vaughan Browner, please advise on inhaler change.  ?

## 2022-03-05 ENCOUNTER — Encounter: Payer: Self-pay | Admitting: Pulmonary Disease

## 2022-03-05 NOTE — Telephone Encounter (Signed)
Per pharmacy message ?AirDuo is $41.56 through [facility]rx at [place] and $43.14 at [facility] through [facility]. ? ?Can you please order AirDuo RespiClick 916/94.  Take 1 puff twice daily ?

## 2022-03-05 NOTE — Telephone Encounter (Signed)
See my chart encounter.

## 2022-03-06 MED ORDER — AIRDUO DIGIHALER 232-14 MCG/ACT IN AEPB: 1.0000 | INHALATION_SPRAY | Freq: Two times a day (BID) | RESPIRATORY_TRACT | 5 refills | Status: DC

## 2022-03-27 NOTE — Telephone Encounter (Signed)
Dr. Vaughan Browner, please advise on pt's email. Pt has been on Airduo for nearly 3 weeks and she is still having trouble with her cough. Pt states it is now more intense and causing emesis. Please advise. Thanks.  ?

## 2022-03-27 NOTE — Telephone Encounter (Signed)
Called patient but she did not answer. Left message for her to call back.  

## 2022-04-01 ENCOUNTER — Other Ambulatory Visit (HOSPITAL_COMMUNITY): Payer: Self-pay

## 2022-04-01 NOTE — Telephone Encounter (Signed)
Pharmacy team, can you see how much pts AirDuo would be through [place]. Thanks.  ?

## 2022-04-11 ENCOUNTER — Ambulatory Visit (INDEPENDENT_AMBULATORY_CARE_PROVIDER_SITE_OTHER): Payer: BC Managed Care – PPO | Admitting: Pulmonary Disease

## 2022-04-11 ENCOUNTER — Encounter: Payer: Self-pay | Admitting: Pulmonary Disease

## 2022-04-11 ENCOUNTER — Ambulatory Visit: Payer: BC Managed Care – PPO | Admitting: Pulmonary Disease

## 2022-04-11 VITALS — BP 134/76 | HR 66 | Temp 98.1°F | Ht 66.0 in | Wt 163.2 lb

## 2022-04-11 DIAGNOSIS — R053 Chronic cough: Secondary | ICD-10-CM

## 2022-04-11 LAB — PULMONARY FUNCTION TEST
DL/VA % pred: 90 %
DL/VA: 3.76 ml/min/mmHg/L
DLCO cor % pred: 79 %
DLCO cor: 17.12 ml/min/mmHg
DLCO unc % pred: 77 %
DLCO unc: 16.63 ml/min/mmHg
FEF 25-75 Post: 2.61 L/sec
FEF 25-75 Pre: 2.52 L/sec
FEF2575-%Change-Post: 3 %
FEF2575-%Pred-Post: 107 %
FEF2575-%Pred-Pre: 104 %
FEV1-%Change-Post: -1 %
FEV1-%Pred-Post: 94 %
FEV1-%Pred-Pre: 96 %
FEV1-Post: 2.58 L
FEV1-Pre: 2.63 L
FEV1FVC-%Change-Post: 1 %
FEV1FVC-%Pred-Pre: 105 %
FEV6-%Change-Post: -3 %
FEV6-%Pred-Post: 91 %
FEV6-%Pred-Pre: 94 %
FEV6-Post: 3.11 L
FEV6-Pre: 3.21 L
FEV6FVC-%Pred-Post: 103 %
FEV6FVC-%Pred-Pre: 103 %
FVC-%Change-Post: -3 %
FVC-%Pred-Post: 88 %
FVC-%Pred-Pre: 91 %
FVC-Post: 3.11 L
FVC-Pre: 3.21 L
Post FEV1/FVC ratio: 83 %
Post FEV6/FVC ratio: 100 %
Pre FEV1/FVC ratio: 82 %
Pre FEV6/FVC Ratio: 100 %
RV % pred: 93 %
RV: 1.97 L
TLC % pred: 95 %
TLC: 5.12 L

## 2022-04-11 NOTE — Progress Notes (Signed)
PFT done today. 

## 2022-04-11 NOTE — Progress Notes (Signed)
? ?      ?Adriana Romero    [id number]    1961/08/21 ? ?Primary Care Physician:Wharton, Loma Sousa, PA-C ? ?Referring Physician: Marda Stalker, PA-C ?[address] ?[place],  [place] [address] ? ?Chief complaint: Follow up for chronic cough, dyspnea ? ?HPI: ?61 year old with history of allergies, chronic sinus infections ?Complains of chronic cough for over 10 years.  She was previously evaluated by Dr. Gwenette Greet in 2014 for upper airway cough which was treated with antiacid medication and treatment of postnasal drip.  There was temporary improvement but symptoms returned.  She has symptoms which are worsened by speaking, change in temperature ?Has persistent issues with chronic maxillary sinusitis and underwent sinus surgery, septoplasty by Dr. Gaylyn Cheers at [facility] on February 05, 2022 ? ?She has seasonal allergies, denies acid reflux.  Had been on PPI in the past without any improvement ? ?Pets: No pets ?Occupation: Works as a Research officer, trade union ?Exposures: No mold, hot tub, Jacuzzi.  No feather pillows or comforter ?Smoking history: Never smoker.  Had been exposed to secondhand ?Travel history: From [place].  No significant recent travel ?Relevant family history: Mom had emphysema ? ?Interim history: ? ? ? ?Outpatient Encounter Medications as of 04/11/2022  ?Medication Sig  ? acetaminophen (TYLENOL) 325 MG tablet Take 650 mg by mouth every 6 (six) hours as needed.  ? Collagen Hydrolysate, Bovine, POWD by Does not apply route 3 (three) times a week.  ? Fiber POWD Take by mouth 3 (three) times a week.  ? Fluticasone-Salmeterol,sensor, (AIRDUO DIGIHALER) 232-14 MCG/ACT AEPB Inhale 1 puff into the lungs in the morning and at bedtime.  ? oxybutynin (DITROPAN-XL) 10 MG 24 hr tablet Take 10 mg by mouth daily.  ? sodium chloride (OCEAN) 0.65 % SOLN nasal spray Place 1 spray into both nostrils as needed for congestion.  ? timolol (TIMOPTIC) 0.5 % ophthalmic solution Place 1 drop into the right eye every morning.  ?  albuterol (VENTOLIN HFA) 108 (90 Base) MCG/ACT inhaler 1 puff as needed (Patient not taking: Reported on 04/11/2022)  ? [DISCONTINUED] bacitracin 500 UNIT/GM ointment Apply 1 application topically 2 (two) times daily.  ? [DISCONTINUED] budesonide-formoterol (SYMBICORT) 160-4.5 MCG/ACT inhaler Inhale 2 puffs into the lungs in the morning and at bedtime.  ? ?No facility-administered encounter medications on file as of 04/11/2022.  ? ? ?Physical Exam: ?Blood pressure 134/76, pulse 66, temperature 98.1 ?F (36.7 ?C), temperature source Oral, height '5\' 6"'$  (1.676 m), weight 163 lb 3.2 oz (74 kg), SpO2 99 %. ?Gen:      No acute distress ?HEENT:  EOMI, sclera anicteric ?Neck:     No masses; no thyromegaly ?Lungs:    Clear to auscultation bilaterally; normal respiratory effort ?CV:         Regular rate and rhythm; no murmurs ?Abd:      + bowel sounds; soft, non-tender; no palpable masses, no distension ?Ext:    No edema; adequate peripheral perfusion ?Skin:      Warm and dry; no rash ?Neuro: alert and oriented x 3 ?Psych: normal mood and affect  ? ?Data Reviewed: ?Imaging: ?CT sinuses 06/01/2020-focal left maxillary sinusitis with complete obstruction, leftward septal spurring. ? ?Chest x-ray 02/17/2022-no active cardiopulmonary disease ?I have reviewed the images personally. ? ?PFTs: ?Spirometry 08/25/2013 ?FVC 3.4 [97%], FEV1 2.6 [93%], F/F 77 ?Normal spirometry ? ?PFTs 04/11/22 ?FVC 3.11 [88%], FEV1 2.58 [98%], F/F 83, TLC 5.12 [95%], DLCO 16.63 [77%] ?Normal test ? ? ? ?Labs: ?CBC 02/17/2022- WBC 8, eos 2.4%, absolute  eosinophil count 192 ?IgE 02/17/2022- 35 ? ?Assessment:  ?Chronic cough ?She has been treated extensively on the past for post nasal drip and GERD without improvement.  Not interested in retrying therapies as they have been ineffective in the past ?Recently underwent sinus surgery for chronic sinusitis ?May have reactive airway disease based on presence of seasonal allergies ?Blood work-up including CBC, IgE, chest  x-ray and PFTs are normal.  There is low suspicion for interstitial lung disease so we will hold off on CT scan ?She has not responded to albuterol and will stop it ? ?Refer to Dr. Joya Gaskins at [facility] for evaluation of chronic cough and speech therapy.  She would prefer to return to clinic as needed ? ?Plan/Recommendations: ?Refer to ENT at [facility] for chronic cough and speech therapy ? ?Marshell Garfinkel MD ?[facility] ?04/11/2022, 4:11 PM ? ?CC: Marda Stalker, PA-C ? ?  ?

## 2022-04-18 DIAGNOSIS — H348312 Tributary (branch) retinal vein occlusion, right eye, stable: Secondary | ICD-10-CM | POA: Diagnosis not present

## 2022-04-18 DIAGNOSIS — H401111 Primary open-angle glaucoma, right eye, mild stage: Secondary | ICD-10-CM | POA: Diagnosis not present

## 2022-04-18 DIAGNOSIS — H31091 Other chorioretinal scars, right eye: Secondary | ICD-10-CM | POA: Diagnosis not present

## 2022-04-18 DIAGNOSIS — H35033 Hypertensive retinopathy, bilateral: Secondary | ICD-10-CM | POA: Diagnosis not present

## 2022-04-29 ENCOUNTER — Telehealth: Payer: Self-pay

## 2022-04-29 NOTE — Telephone Encounter (Signed)
NOTES ATTACHED TO CHART ?

## 2022-05-05 ENCOUNTER — Other Ambulatory Visit: Payer: Self-pay | Admitting: Family Medicine

## 2022-05-05 DIAGNOSIS — Z1231 Encounter for screening mammogram for malignant neoplasm of breast: Secondary | ICD-10-CM

## 2022-05-12 ENCOUNTER — Ambulatory Visit: Payer: BC Managed Care – PPO | Admitting: Cardiovascular Disease

## 2022-05-12 ENCOUNTER — Encounter: Payer: Self-pay | Admitting: Cardiovascular Disease

## 2022-05-12 VITALS — BP 135/80 | HR 66 | Ht 66.0 in | Wt 160.2 lb

## 2022-05-12 DIAGNOSIS — R002 Palpitations: Secondary | ICD-10-CM | POA: Diagnosis not present

## 2022-05-12 DIAGNOSIS — Z79899 Other long term (current) drug therapy: Secondary | ICD-10-CM | POA: Diagnosis not present

## 2022-05-12 DIAGNOSIS — Z8249 Family history of ischemic heart disease and other diseases of the circulatory system: Secondary | ICD-10-CM

## 2022-05-12 DIAGNOSIS — R053 Chronic cough: Secondary | ICD-10-CM | POA: Diagnosis not present

## 2022-05-12 DIAGNOSIS — J320 Chronic maxillary sinusitis: Secondary | ICD-10-CM

## 2022-05-12 NOTE — Progress Notes (Signed)
Cardiology Office Note    Date:  05/19/2022   ID:  Adriana Romero, DOB [birth year], MRN [id number]  PCP:  Marda Stalker, PA-C  Cardiologist:  Shelva Majestic, MD   New cardiology evaluation referred through the courtesy of Marda Stalker, PA for palpitations.   History of Present Illness:  Adriana Romero is a 61 y.o. female who is followed by Marda Stalker, PA for primary care and sees Dr. Marshell Garfinkel at [facility] pulmonary for chronic cough and dyspnea.  Patient has a history of allergies, chronic sinus infections, and has experienced chronic cough for approximately 10 years.  Remotely she had seen Dr. Gwenette Greet in 2014 for upper airway cough which was treated with antacids and treatment of postnasal drip.  Ultimately symptoms recurred.  She had issues with chronic maxillary sinusitis in February 05, 2022 underwent sinus surgery, septoplasty by Dr. Gaylyn Cheers in [place].  She last saw Dr. Vaughan Browner in April 11, 2022.  There was a low suspicion for interstitial lung disease and it was felt she may have reactive airway disease based on presence of seasonal allergies.  She was also referred to Dr. Joya Gaskins at [facility] for further evaluation of her chronic cough and speech therapy.  Recently, she has noted some palpitations which can sometimes cause her to cough.  She denies any episodes of sustained tachycardia or arrhythmia.  She is on air duo dig inhaler and has a prescription for albuterol which has not used.  She denies any chest pain.  She denies any presyncope or syncope, PND orthopnea.  Her mother was a heavy smoker and had emphysema and suffered a heart attack and died at age 33.  Her father is currently living and also has heart issues.  She is now referred for cardiology evaluation.  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     Collagen Hydrolysate, Bovine, POWD by Does not apply route 3 (three) times a week.     Fiber POWD Take by mouth 3 (three) times a week.     oxybutynin (DITROPAN-XL) 10 MG 24 hr tablet Take 10 mg by mouth daily.     sodium chloride (OCEAN) 0.65 % SOLN nasal spray Place 1 spray into both nostrils as needed for congestion.     timolol (TIMOPTIC) 0.5 % ophthalmic solution Place 1 drop into the right eye every morning.     albuterol (VENTOLIN HFA) 108 (90 Base) MCG/ACT inhaler 1 puff as needed (Patient not taking: Reported on 04/11/2022)     Fluticasone-Salmeterol,sensor, (AIRDUO DIGIHALER) 232-14 MCG/ACT AEPB Inhale 1 puff into the lungs in the morning and at bedtime. 1 each 5   No facility-administered medications prior to visit.  Allergies:   Gluten meal, Grape flavor [flavoring agent], and Sulfa antibiotics   Social History   Socioeconomic History   Marital status: Married    Spouse name: Not on file   Number of children: Not on file   Years of education: Not on file   Highest education level: Not on file  Occupational History   Occupation: Financial admin  Tobacco Use   Smoking status: Never    Passive exposure: Past (Mother and grandparents as a child)   Smokeless tobacco: Never  Vaping Use   Vaping Use: Never used  Substance and Sexual Activity   Alcohol use: Not Currently   Drug use: No   Sexual activity: Yes    Comment: 1st intercourse 61 yo-More than 5 partners  Other  Topics Concern   Not on file  Social History Narrative   Not on file   Social Determinants of Health   Financial Resource Strain: Not on file  Food Insecurity: Not on file  Transportation Needs: Not on file  Physical Activity: Not on file  Stress: Not on file  Social Connections: Not on file    Social history is notable that she was born in [place].  She is married for 28 years.  She is a Research officer, trade union.  There is no tobacco or alcohol history.  Family History:  The patient's family history includes Allergies in her brother and another family member; Asthma in an other family member; Breast cancer (age of onset: 21) in her maternal grandmother; Cancer in her paternal aunt; Clotting disorder in her father; Colon polyps in her father; Emphysema in her mother; Heart disease in her mother; Hypertension in her father; Skin cancer in her brother, father, and sister.  Mother died at age 4 and had a heart attack and COPD/emphysema.  Father is 42 and living but has heart issues and hypertension.  She has 2 brothers 1 with hypertension and follicular lymphoma another with hiatal hernia.  She has 1 sister who has skin cancer.  ROS General: Negative; No fevers, chills, or night sweats;  HEENT: History of chronic maxillary sinusitis, status post left maxillary surgery February 05, 2022; No changes in vision or hearing,  difficulty swallowing Pulmonary: Negative; No cough, wheezing, shortness of breath, hemoptysis Cardiovascular: See HPI GI: Negative; No nausea, vomiting, diarrhea, or abdominal pain GU: Negative; No dysuria, hematuria, or difficulty voiding Musculoskeletal: Negative; no myalgias, joint pain, or weakness Hematologic/Oncology: Negative; no easy bruising, bleeding Endocrine: Negative; no heat/cold intolerance; no diabetes Neuro: Negative; no changes in balance, headaches Skin: Negative; No rashes or skin lesions Psychiatric: Negative; No behavioral problems,  depression Sleep: Negative; No snoring, daytime sleepiness, hypersomnolence, bruxism, restless legs, hypnogognic hallucinations, no cataplexy Other comprehensive 14 point system review is negative.   PHYSICAL EXAM:   VS:  BP 135/80   Pulse 66   Ht '5\' 6"'$  (1.676 m)   Wt 160 lb 3.2 oz (72.7 kg)   SpO2 96%   BMI 25.86 kg/m     Repeat blood pressure by me was 126/72  Wt Readings from Last 3 Encounters:  05/12/22 160 lb 3.2 oz (72.7 kg)  04/11/22 163 lb 3.2 oz (74 kg)  02/17/22 165 lb (74.8 kg)    General: Alert, oriented, no distress.  Skin: normal turgor, no rashes, warm and dry HEENT: Normocephalic, atraumatic. Pupils equal round and reactive to light; sclera anicteric; extraocular muscles intact;  Nose without nasal septal hypertrophy Mouth/Parynx benign; Mallinpatti scale 3 Neck: No JVD, no carotid  bruits; normal carotid upstroke Lungs: clear to ausculatation and percussion; no wheezing or rales Chest wall: without tenderness to palpitation Heart: PMI not displaced, RRR, s1 s2 normal, 1/6 systolic murmur, no diastolic murmur, no rubs, gallops, thrills, or heaves Abdomen: soft, nontender; no hepatosplenomehaly, BS+; abdominal aorta nontender and not dilated by palpation. Back: no CVA tenderness Pulses 2+ Musculoskeletal: full range of motion, normal strength, no joint deformities Extremities: no clubbing cyanosis or edema, Homan's sign negative  Neurologic: grossly nonfocal; Cranial nerves grossly wnl Psychologic: Normal mood and affect   Studies/Labs Reviewed:   ECG (independently read by me): NSR at 66, no ectopy  Recent Labs:  I reviewed the recent laboratory from January 24, 2022 by Marda Stalker, PA      View : No data to display.               View : No data to display.             Latest Ref Rng & Units 02/17/2022   10:12 AM  CBC  WBC 4.0 - 10.5 K/uL 8.0    Hemoglobin 12.0 - 15.0 g/dL 12.5    Hematocrit 36.0 - 46.0 % 37.9    Platelets 150.0  - 400.0 K/uL 316.0     Lab Results  Component Value Date   MCV 93.5 02/17/2022   No results found for: TSH No results found for: HGBA1C   BNP No results found for: BNP  ProBNP No results found for: PROBNP   Lipid Panel  No results found for: CHOL, TRIG, HDL, CHOLHDL, VLDL, LDLCALC, LDLDIRECT, LABVLDL   RADIOLOGY: No results found.   Additional studies/ records that were reviewed today include:  Records of Marda Stalker, [place] and Dr. Marshell Garfinkel were reviewed.   ASSESSMENT:    1. Chronic cough   2. Palpitations   3. Family history of early CAD   73. Medication management   5. Chronic maxillary sinusitis     PLAN:  Ms. Careena Degraffenreid is a 61 year old female who denies any awareness of prior cardiac disease but has family history for heart disease in her mother who was a heavy smoker, had emphysema and ultimately suffered a myocardial infarction.  Patient has had issues with chronic cough for over 10 years with chronic maxillary sinusitis and recently required sinus surgery by Dr. Gaylyn Cheers in [place] of her left maxillary sinus.  She denies any significant chest pain symptomatology.  She has had some issues with dyspnea and has been given an inhaler by Dr. Vaughan Browner with some benefit.  Remotely, she was felt to have upper airway cough treated with antacid medication for treatment of postnasal drip with only temporary improvement.  She denies any episodes of chest pain.  She denies any symptomatic periods of arrhythmia but occasionally she has noticed rare palpitations creating the urge to cough.  Presently, I am recommending she undergo a 2D echo Doppler study for evaluation of both systolic and diastolic function and valvular architecture.  With her family history for CAD I will schedule her for baseline screening calcium score.  I am recommending laboratory be rechecked including a comprehensive metabolic panel, fasting lipid study and will also check LP(a).  I will contact her regarding  her results and adjustments to her regimen will be made if necessary.  I will see her in 3 months for follow-up evaluation.    Medication Adjustments/Labs and Tests Ordered: Current medicines are reviewed at length with the patient today.  Concerns regarding medicines are  outlined above.  Medication changes, Labs and Tests ordered today are listed in the Patient Instructions below. Patient Instructions  Medication Instructions:  Your Physician recommend you continue on your current medication as directed.    *If you need a refill on your cardiac medications before your next appointment, please call your pharmacy*   Lab Work: Your physician recommends that you return for lab work (CMP, Lipid, LPa)  If you have labs (blood work) drawn today and your tests are completely normal, you will receive your results only by: MyChart Message (if you have MyChart) OR A paper copy in the mail If you have any lab test that is abnormal or we need to change your treatment, we will call you to review the results.   Testing/Procedures: Your physician has requested that you have an echocardiogram. Echocardiography is a painless test that uses sound waves to create images of your heart. It provides your doctor with information about the size and shape of your heart and how well your heart's chambers and valves are working. This procedure takes approximately one hour. There are no restrictions for this procedure. [place] 300  CT coronary calcium score.   Test locations:  [facility] ([address] [place], [place] [address]) [facility] ([address] [place], [place] [address])   This is $99 out of pocket.   Coronary CalciumScan A coronary calcium scan is an imaging test used to look for deposits of calcium and other fatty materials (plaques) in the inner lining of the blood vessels of the heart (coronary arteries). These deposits of calcium and plaques can  partly clog and narrow the coronary arteries without producing any symptoms or warning signs. This puts a person at risk for a heart attack. This test can detect these deposits before symptoms develop. Tell a health care provider about: Any allergies you have. All medicines you are taking, including vitamins, herbs, eye drops, creams, and over-the-counter medicines. Any problems you or family members have had with anesthetic medicines. Any blood disorders you have. Any surgeries you have had. Any medical conditions you have. Whether you are pregnant or may be pregnant. What are the risks? Generally, this is a safe procedure. However, problems may occur, including: Harm to a pregnant woman and her unborn baby. This test involves the use of radiation. Radiation exposure can be dangerous to a pregnant woman and her unborn baby. If you are pregnant, you generally should not have this procedure done. Slight increase in the risk of cancer. This is because of the radiation involved in the test. What happens before the procedure? No preparation is needed for this procedure. What happens during the procedure? You will undress and remove any jewelry around your neck or chest. You will put on a hospital gown. Sticky electrodes will be placed on your chest. The electrodes will be connected to an electrocardiogram (ECG) machine to record a tracing of the electrical activity of your heart. A CT scanner will take pictures of your heart. During this time, you will be asked to lie still and hold your breath for 2-3 seconds while a picture of your heart is being taken. The procedure may vary among health care providers and hospitals. What happens after the procedure? You can get dressed. You can return to your normal activities. It is up to you to get the results of your test. Ask your health care provider, or the department that is doing the test, when your results will be  ready. Summary A coronary calcium  scan is an imaging test used to look for deposits of calcium and other fatty materials (plaques) in the inner lining of the blood vessels of the heart (coronary arteries). Generally, this is a safe procedure. Tell your health care provider if you are pregnant or may be pregnant. No preparation is needed for this procedure. A CT scanner will take pictures of your heart. You can return to your normal activities after the scan is done. This information is not intended to replace advice given to you by your health care provider. Make sure you discuss any questions you have with your health care provider. Document Released: 06/05/2008 Document Revised: 10/27/2016 Document Reviewed: 10/27/2016 Elsevier Interactive Patient Education  2017 [place]: At [facility], you and your health needs are our priority.  As part of our continuing mission to provide you with exceptional heart care, we have created designated Provider Care Teams.  These Care Teams include your primary Cardiologist (physician) and Advanced Practice Providers (APPs -  Physician Assistants and Nurse Practitioners) who all work together to provide you with the care you need, when you need it.  We recommend signing up for the patient portal called "MyChart".  Sign up information is provided on this After Visit Summary.  MyChart is used to connect with patients for Virtual Visits (Telemedicine).  Patients are able to view lab/test results, encounter notes, upcoming appointments, etc.  Non-urgent messages can be sent to your provider as well.   To learn more about what you can do with MyChart, go to [email or web address].    Your next appointment:   3 month(s)  The format for your next appointment:   In Person  Provider:   Shelva Majestic, MD {   Important Information About Sugar         Signed, Shelva Majestic, MD  05/19/2022 3:01 PM    [facility] [address] [place],  [place], [place]  [address] Phone: [phone]

## 2022-05-12 NOTE — Patient Instructions (Signed)
Medication Instructions:  Your Physician recommend you continue on your current medication as directed.    *If you need a refill on your cardiac medications before your next appointment, please call your pharmacy*   Lab Work: Your physician recommends that you return for lab work (CMP, Lipid, LPa)  If you have labs (blood work) drawn today and your tests are completely normal, you will receive your results only by: MyChart Message (if you have MyChart) OR A paper copy in the mail If you have any lab test that is abnormal or we need to change your treatment, we will call you to review the results.   Testing/Procedures: Your physician has requested that you have an echocardiogram. Echocardiography is a painless test that uses sound waves to create images of your heart. It provides your doctor with information about the size and shape of your heart and how well your heart's chambers and valves are working. This procedure takes approximately one hour. There are no restrictions for this procedure. [place] 300  CT coronary calcium score.   Test locations:  [facility] ([address] [place], [place] [address]) [facility] ([address] [place], [place] [address])   This is $99 out of pocket.   Coronary CalciumScan A coronary calcium scan is an imaging test used to look for deposits of calcium and other fatty materials (plaques) in the inner lining of the blood vessels of the heart (coronary arteries). These deposits of calcium and plaques can partly clog and narrow the coronary arteries without producing any symptoms or warning signs. This puts a person at risk for a heart attack. This test can detect these deposits before symptoms develop. Tell a health care provider about: Any allergies you have. All medicines you are taking, including vitamins, herbs, eye drops, creams, and over-the-counter medicines. Any problems you or family members have had  with anesthetic medicines. Any blood disorders you have. Any surgeries you have had. Any medical conditions you have. Whether you are pregnant or may be pregnant. What are the risks? Generally, this is a safe procedure. However, problems may occur, including: Harm to a pregnant woman and her unborn baby. This test involves the use of radiation. Radiation exposure can be dangerous to a pregnant woman and her unborn baby. If you are pregnant, you generally should not have this procedure done. Slight increase in the risk of cancer. This is because of the radiation involved in the test. What happens before the procedure? No preparation is needed for this procedure. What happens during the procedure? You will undress and remove any jewelry around your neck or chest. You will put on a hospital gown. Sticky electrodes will be placed on your chest. The electrodes will be connected to an electrocardiogram (ECG) machine to record a tracing of the electrical activity of your heart. A CT scanner will take pictures of your heart. During this time, you will be asked to lie still and hold your breath for 2-3 seconds while a picture of your heart is being taken. The procedure may vary among health care providers and hospitals. What happens after the procedure? You can get dressed. You can return to your normal activities. It is up to you to get the results of your test. Ask your health care provider, or the department that is doing the test, when your results will be ready. Summary A coronary calcium scan is an imaging test used to look for deposits of calcium and other fatty  materials (plaques) in the inner lining of the blood vessels of the heart (coronary arteries). Generally, this is a safe procedure. Tell your health care provider if you are pregnant or may be pregnant. No preparation is needed for this procedure. A CT scanner will take pictures of your heart. You can return to your normal activities  after the scan is done. This information is not intended to replace advice given to you by your health care provider. Make sure you discuss any questions you have with your health care provider. Document Released: 06/05/2008 Document Revised: 10/27/2016 Document Reviewed: 10/27/2016 Elsevier Interactive Patient Education  2017 [place]: At [facility], you and your health needs are our priority.  As part of our continuing mission to provide you with exceptional heart care, we have created designated Provider Care Teams.  These Care Teams include your primary Cardiologist (physician) and Advanced Practice Providers (APPs -  Physician Assistants and Nurse Practitioners) who all work together to provide you with the care you need, when you need it.  We recommend signing up for the patient portal called "MyChart".  Sign up information is provided on this After Visit Summary.  MyChart is used to connect with patients for Virtual Visits (Telemedicine).  Patients are able to view lab/test results, encounter notes, upcoming appointments, etc.  Non-urgent messages can be sent to your provider as well.   To learn more about what you can do with MyChart, go to [email or web address].    Your next appointment:   3 month(s)  The format for your next appointment:   In Person  Provider:   Shelva Majestic, MD {   Important Information About Sugar

## 2022-05-19 ENCOUNTER — Encounter: Payer: Self-pay | Admitting: Cardiovascular Disease

## 2022-05-22 DIAGNOSIS — Z79899 Other long term (current) drug therapy: Secondary | ICD-10-CM | POA: Diagnosis not present

## 2022-05-23 LAB — COMPREHENSIVE METABOLIC PANEL
ALT: 13 IU/L (ref 0–32)
AST: 11 IU/L (ref 0–40)
Albumin/Globulin Ratio: 2.1 (ref 1.2–2.2)
Albumin: 4.7 g/dL (ref 3.8–4.8)
Alkaline Phosphatase: 44 IU/L (ref 44–121)
BUN/Creatinine Ratio: 17 (ref 12–28)
BUN: 14 mg/dL (ref 8–27)
Bilirubin Total: 0.3 mg/dL (ref 0.0–1.2)
CO2: 27 mmol/L (ref 20–29)
Calcium: 9.7 mg/dL (ref 8.7–10.3)
Chloride: 104 mmol/L (ref 96–106)
Creatinine, Ser: 0.84 mg/dL (ref 0.57–1.00)
Globulin, Total: 2.2 g/dL (ref 1.5–4.5)
Glucose: 97 mg/dL (ref 70–99)
Potassium: 4.6 mmol/L (ref 3.5–5.2)
Sodium: 142 mmol/L (ref 134–144)
Total Protein: 6.9 g/dL (ref 6.0–8.5)
eGFR: 79 mL/min/{1.73_m2} (ref >59)

## 2022-05-23 LAB — LIPID PANEL
Chol/HDL Ratio: 3.4 ratio (ref 0.0–4.4)
Cholesterol, Total: 264 mg/dL — ABNORMAL HIGH (ref 100–199)
HDL: 78 mg/dL (ref >39)
LDL Chol Calc (NIH): 169 mg/dL — ABNORMAL HIGH (ref 0–99)
Triglycerides: 102 mg/dL (ref 0–149)
VLDL Cholesterol Cal: 17 mg/dL (ref 5–40)

## 2022-05-23 LAB — LIPOPROTEIN A (LPA): Lipoprotein (a): 62.6 nmol/L (ref <75.0)

## 2022-06-05 ENCOUNTER — Ambulatory Visit (INDEPENDENT_AMBULATORY_CARE_PROVIDER_SITE_OTHER)
Admission: RE | Admit: 2022-06-05 | Discharge: 2022-06-05 | Disposition: A | Discharge status: Home / Self Care | Payer: Self-pay | Source: Ambulatory Visit | Attending: Cardiovascular Disease | Admitting: Cardiovascular Disease

## 2022-06-05 ENCOUNTER — Ambulatory Visit (HOSPITAL_COMMUNITY): Payer: BC Managed Care – PPO | Attending: Internal Medicine

## 2022-06-05 DIAGNOSIS — I361 Nonrheumatic tricuspid (valve) insufficiency: Secondary | ICD-10-CM | POA: Diagnosis not present

## 2022-06-05 DIAGNOSIS — R053 Chronic cough: Secondary | ICD-10-CM | POA: Diagnosis not present

## 2022-06-05 DIAGNOSIS — Z8249 Family history of ischemic heart disease and other diseases of the circulatory system: Secondary | ICD-10-CM

## 2022-06-05 LAB — ECHOCARDIOGRAM COMPLETE
Area-P 1/2: 3.88 cm2
S' Lateral: 2.2 cm

## 2022-06-10 ENCOUNTER — Other Ambulatory Visit: Payer: Self-pay

## 2022-06-10 DIAGNOSIS — Z79899 Other long term (current) drug therapy: Secondary | ICD-10-CM

## 2022-06-10 DIAGNOSIS — Z8249 Family history of ischemic heart disease and other diseases of the circulatory system: Secondary | ICD-10-CM

## 2022-06-10 MED ORDER — ROSUVASTATIN CALCIUM 20 MG PO TABS: 20.0000 mg | ORAL_TABLET | Freq: Every day | ORAL | 3 refills | Status: DC

## 2022-06-17 DIAGNOSIS — J385 Laryngeal spasm: Secondary | ICD-10-CM | POA: Diagnosis not present

## 2022-06-17 DIAGNOSIS — R058 Other specified cough: Secondary | ICD-10-CM | POA: Diagnosis not present

## 2022-06-17 DIAGNOSIS — R0989 Other specified symptoms and signs involving the circulatory and respiratory systems: Secondary | ICD-10-CM | POA: Diagnosis not present

## 2022-06-17 DIAGNOSIS — J383 Other diseases of vocal cords: Secondary | ICD-10-CM | POA: Diagnosis not present

## 2022-06-17 DIAGNOSIS — R490 Dysphonia: Secondary | ICD-10-CM | POA: Diagnosis not present

## 2022-07-01 ENCOUNTER — Ambulatory Visit
Admission: RE | Admit: 2022-07-01 | Discharge: 2022-07-01 | Disposition: A | Discharge status: Home / Self Care | Payer: BC Managed Care – PPO | Source: Ambulatory Visit

## 2022-07-01 DIAGNOSIS — Z1231 Encounter for screening mammogram for malignant neoplasm of breast: Secondary | ICD-10-CM | POA: Diagnosis not present

## 2022-07-23 DIAGNOSIS — H5340 Unspecified visual field defects: Secondary | ICD-10-CM | POA: Diagnosis not present

## 2022-07-23 DIAGNOSIS — H401131 Primary open-angle glaucoma, bilateral, mild stage: Secondary | ICD-10-CM | POA: Diagnosis not present

## 2022-08-04 ENCOUNTER — Ambulatory Visit: Payer: BC Managed Care – PPO | Admitting: Cardiovascular Disease

## 2022-08-31 IMAGING — DX DG CHEST 2V
2 series · 2 of 2 positions shown · non-contrast
Comparison: Chest radiograph dated 08/25/2013.

CLINICAL DATA: Cough.

EXAM:
CHEST - 2 VIEW

[chest pa]
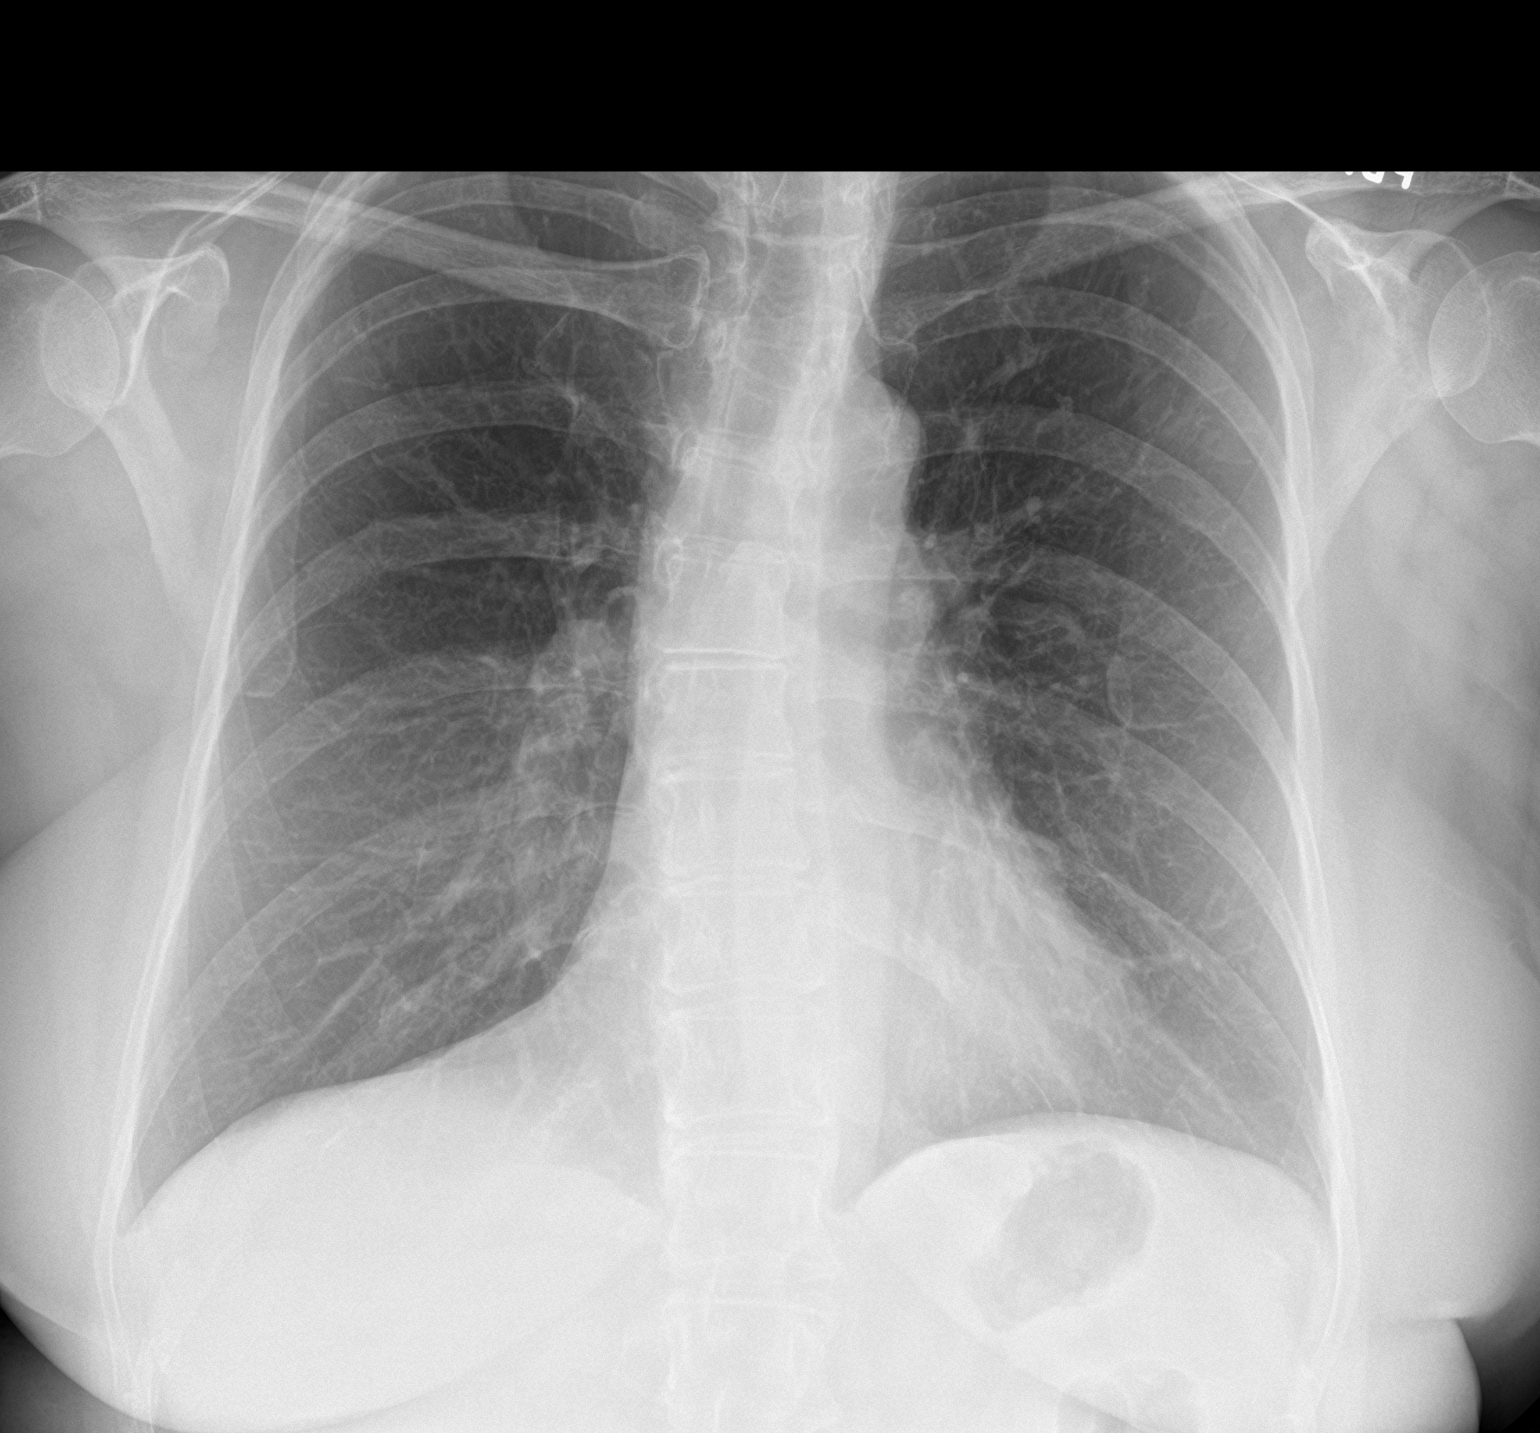

[chest lat]
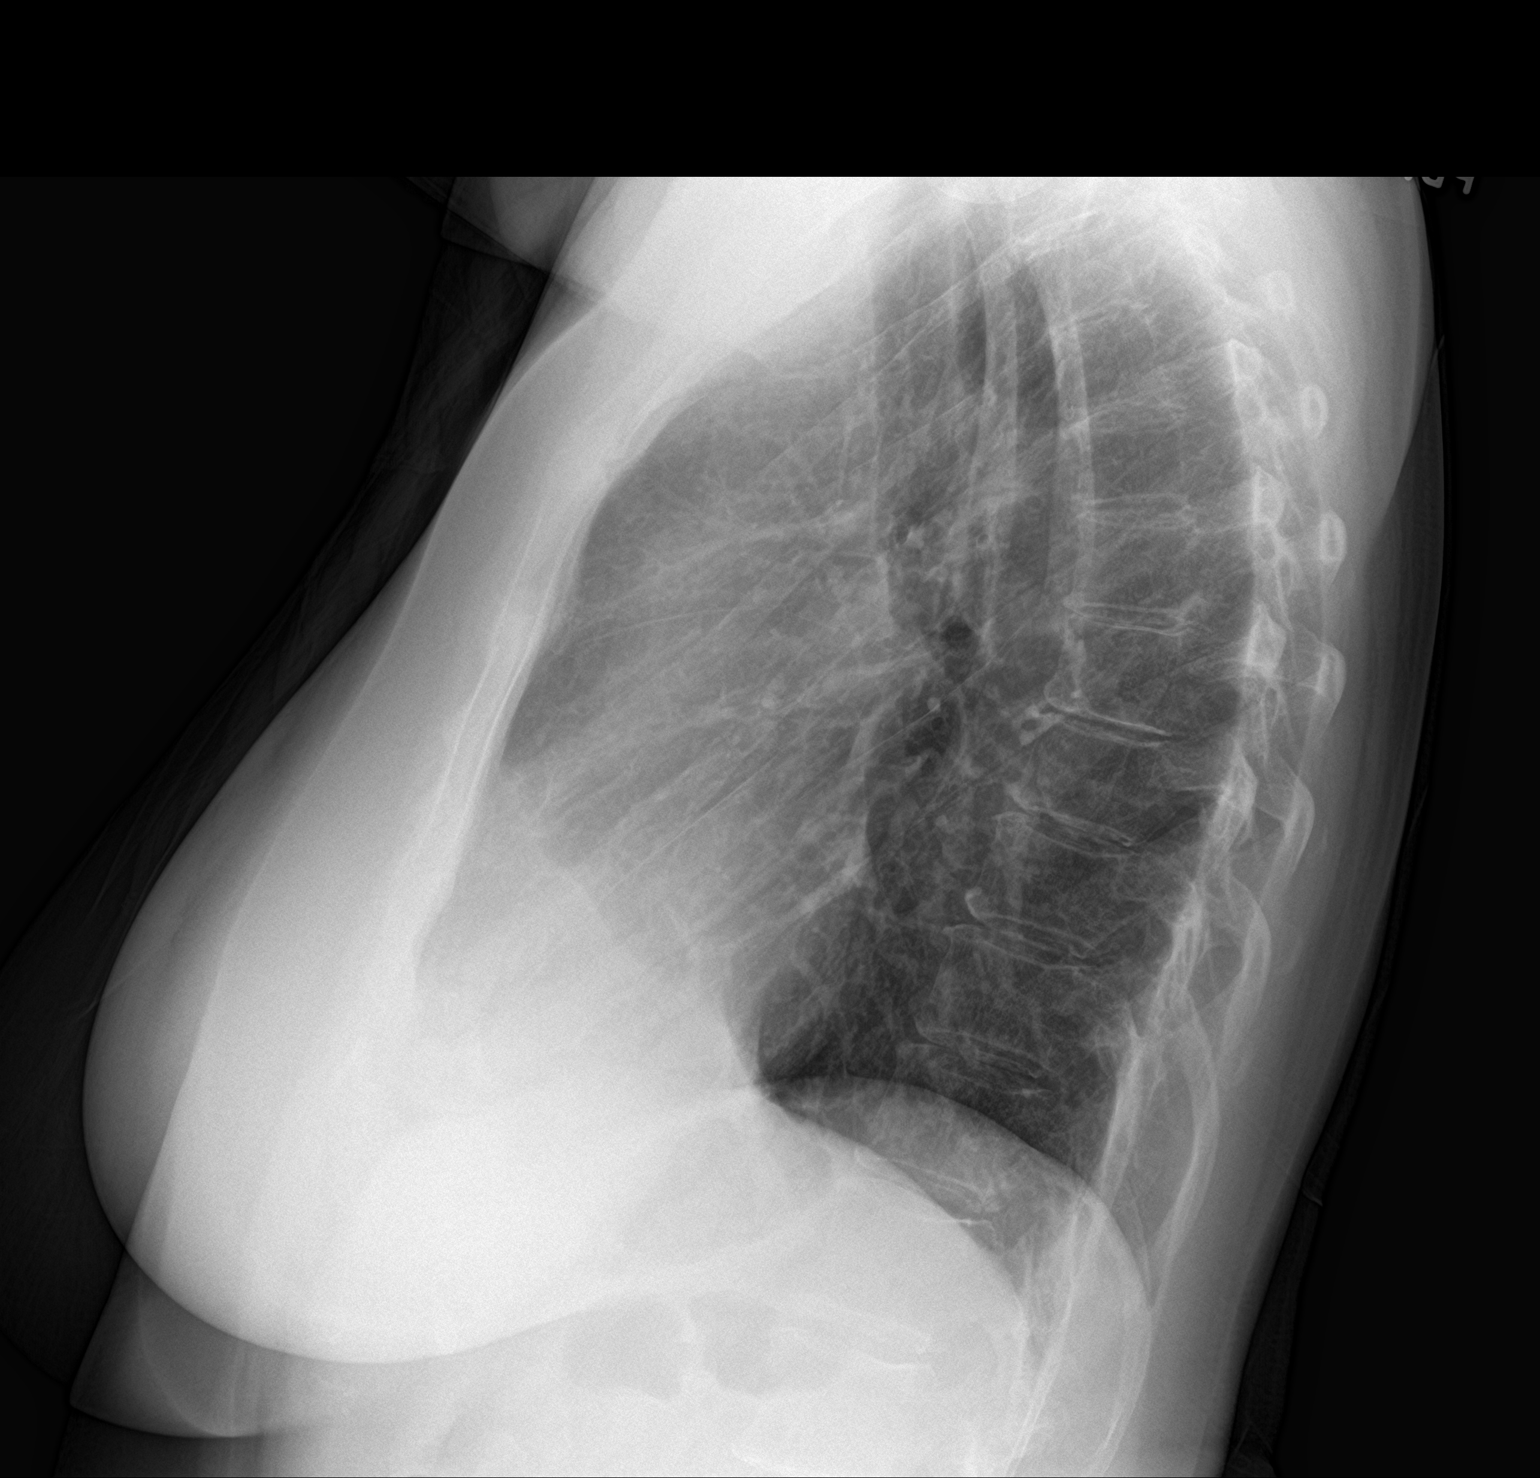

[2 of 2 positions shown; findings below may reference images not displayed]

FINDINGS: No focal consolidation, pleural effusion, pneumothorax. The cardiac
silhouette is within normal limits. No acute osseous pathology.
IMPRESSION: No active cardiopulmonary disease.

## 2022-09-05 DIAGNOSIS — Z0000 Encounter for general adult medical examination without abnormal findings: Secondary | ICD-10-CM | POA: Diagnosis not present

## 2022-09-05 DIAGNOSIS — E7800 Pure hypercholesterolemia, unspecified: Secondary | ICD-10-CM | POA: Diagnosis not present

## 2022-09-05 DIAGNOSIS — Z13220 Encounter for screening for lipoid disorders: Secondary | ICD-10-CM | POA: Diagnosis not present

## 2022-09-05 DIAGNOSIS — Z23 Encounter for immunization: Secondary | ICD-10-CM | POA: Diagnosis not present

## 2022-09-16 DIAGNOSIS — C44722 Squamous cell carcinoma of skin of right lower limb, including hip: Secondary | ICD-10-CM | POA: Diagnosis not present

## 2022-09-16 DIAGNOSIS — L570 Actinic keratosis: Secondary | ICD-10-CM | POA: Diagnosis not present

## 2022-10-20 DIAGNOSIS — H3582 Retinal ischemia: Secondary | ICD-10-CM | POA: Diagnosis not present

## 2022-10-20 DIAGNOSIS — H348312 Tributary (branch) retinal vein occlusion, right eye, stable: Secondary | ICD-10-CM | POA: Diagnosis not present

## 2022-10-20 DIAGNOSIS — H35033 Hypertensive retinopathy, bilateral: Secondary | ICD-10-CM | POA: Diagnosis not present

## 2022-10-20 DIAGNOSIS — H31091 Other chorioretinal scars, right eye: Secondary | ICD-10-CM | POA: Diagnosis not present

## 2022-12-04 ENCOUNTER — Telehealth: Payer: Self-pay

## 2022-12-04 NOTE — Patient Outreach (Signed)
  Care Coordination   Initial Visit Note   12/04/2022 Name: Adriana Romero MRN: [id number] DOB: 1961-11-27  Adriana Romero is a 61 y.o. year old female who sees Marda Stalker, [place] for primary care. I spoke with  Vilma Prader by phone today.  What matters to the patients health and wellness today?  No concerns today    Goals Addressed             This Visit's Progress    COMPLETED: Care Coordination Activities - no follow up required       Care Coordination Interventions: Provided education to patient re: care coordination services Assessed social determinant of health barriers          SDOH assessments and interventions completed:  Yes  SDOH Interventions Today    Flowsheet Row Most Recent Value  SDOH Interventions   Food Insecurity Interventions Intervention Not Indicated  Housing Interventions Intervention Not Indicated  Transportation Interventions Intervention Not Indicated  Utilities Interventions Intervention Not Indicated        Care Coordination Interventions:  Yes, provided   Follow up plan: No further intervention required.   Encounter Outcome:  Pt. Visit Completed  Peter Garter RN, BSN,CCM, CDE Care Management Coordinator [facility] [phone]

## 2022-12-04 NOTE — Patient Instructions (Signed)
Visit Information  Thank you for taking time to visit with me today. Please don't hesitate to contact me if I can be of assistance to you.   Following are the goals we discussed today:   Goals Addressed             This Visit's Progress    COMPLETED: Care Coordination Activities - no follow up required       Care Coordination Interventions: Provided education to patient re: care coordination services Assessed social determinant of health barriers           If you are experiencing a Mental Health or [place] or need someone to talk to, please call the Suicide and Crisis Lifeline: 988 call the [place] National Suicide Prevention Lifeline: 352-577-1200 or TTY: (828) 784-5863 TTY 2501278003) to talk to a trained counselor call 1-800-273-TALK (toll free, 24 hour hotline) go to [facility] [place] [phone]) call 911   Patient verbalizes understanding of instructions and care plan provided today and agrees to view in [place]. Active MyChart status and patient understanding of how to access instructions and care plan via MyChart confirmed with patient.     No further follow up required:    Peter Garter RN, Jackquline Denmark, [facility] [phone]

## 2022-12-09 DIAGNOSIS — L814 Other melanin hyperpigmentation: Secondary | ICD-10-CM | POA: Diagnosis not present

## 2022-12-09 DIAGNOSIS — D225 Melanocytic nevi of trunk: Secondary | ICD-10-CM | POA: Diagnosis not present

## 2022-12-09 DIAGNOSIS — L218 Other seborrheic dermatitis: Secondary | ICD-10-CM | POA: Diagnosis not present

## 2022-12-09 DIAGNOSIS — Z85828 Personal history of other malignant neoplasm of skin: Secondary | ICD-10-CM | POA: Diagnosis not present

## 2023-01-27 DIAGNOSIS — H5203 Hypermetropia, bilateral: Secondary | ICD-10-CM | POA: Diagnosis not present

## 2023-01-27 DIAGNOSIS — H2513 Age-related nuclear cataract, bilateral: Secondary | ICD-10-CM | POA: Diagnosis not present

## 2023-01-27 DIAGNOSIS — H401131 Primary open-angle glaucoma, bilateral, mild stage: Secondary | ICD-10-CM | POA: Diagnosis not present

## 2023-04-03 DIAGNOSIS — L659 Nonscarring hair loss, unspecified: Secondary | ICD-10-CM | POA: Diagnosis not present

## 2023-04-03 DIAGNOSIS — E2839 Other primary ovarian failure: Secondary | ICD-10-CM | POA: Diagnosis not present

## 2023-04-03 DIAGNOSIS — N3281 Overactive bladder: Secondary | ICD-10-CM | POA: Diagnosis not present

## 2023-04-06 ENCOUNTER — Other Ambulatory Visit: Payer: Self-pay | Admitting: Family Medicine

## 2023-04-06 DIAGNOSIS — H35033 Hypertensive retinopathy, bilateral: Secondary | ICD-10-CM | POA: Diagnosis not present

## 2023-04-06 DIAGNOSIS — H3582 Retinal ischemia: Secondary | ICD-10-CM | POA: Diagnosis not present

## 2023-04-06 DIAGNOSIS — H25813 Combined forms of age-related cataract, bilateral: Secondary | ICD-10-CM | POA: Diagnosis not present

## 2023-04-06 DIAGNOSIS — E2839 Other primary ovarian failure: Secondary | ICD-10-CM

## 2023-04-06 DIAGNOSIS — H348312 Tributary (branch) retinal vein occlusion, right eye, stable: Secondary | ICD-10-CM | POA: Diagnosis not present

## 2023-04-07 DIAGNOSIS — H538 Other visual disturbances: Secondary | ICD-10-CM | POA: Diagnosis not present

## 2023-04-07 DIAGNOSIS — H11433 Conjunctival hyperemia, bilateral: Secondary | ICD-10-CM | POA: Diagnosis not present

## 2023-04-07 DIAGNOSIS — H5713 Ocular pain, bilateral: Secondary | ICD-10-CM | POA: Diagnosis not present

## 2023-04-07 DIAGNOSIS — H53143 Visual discomfort, bilateral: Secondary | ICD-10-CM | POA: Diagnosis not present

## 2023-04-22 ENCOUNTER — Other Ambulatory Visit: Payer: Self-pay | Admitting: Family Medicine

## 2023-04-22 DIAGNOSIS — Z1231 Encounter for screening mammogram for malignant neoplasm of breast: Secondary | ICD-10-CM

## 2023-06-02 DIAGNOSIS — L720 Epidermal cyst: Secondary | ICD-10-CM | POA: Diagnosis not present

## 2023-06-02 DIAGNOSIS — L570 Actinic keratosis: Secondary | ICD-10-CM | POA: Diagnosis not present

## 2023-06-02 DIAGNOSIS — D485 Neoplasm of uncertain behavior of skin: Secondary | ICD-10-CM | POA: Diagnosis not present

## 2023-07-07 ENCOUNTER — Ambulatory Visit
Admission: RE | Admit: 2023-07-07 | Discharge: 2023-07-07 | Disposition: A | Discharge status: Home / Self Care | Payer: BC Managed Care – PPO | Source: Ambulatory Visit | Attending: Family Medicine | Admitting: Family Medicine

## 2023-07-07 DIAGNOSIS — Z1231 Encounter for screening mammogram for malignant neoplasm of breast: Secondary | ICD-10-CM

## 2023-07-09 DIAGNOSIS — R195 Other fecal abnormalities: Secondary | ICD-10-CM | POA: Diagnosis not present

## 2023-09-18 DIAGNOSIS — K921 Melena: Secondary | ICD-10-CM | POA: Diagnosis not present

## 2023-09-18 DIAGNOSIS — E538 Deficiency of other specified B group vitamins: Secondary | ICD-10-CM | POA: Diagnosis not present

## 2023-09-18 DIAGNOSIS — Z23 Encounter for immunization: Secondary | ICD-10-CM | POA: Diagnosis not present

## 2023-09-18 DIAGNOSIS — Z0000 Encounter for general adult medical examination without abnormal findings: Secondary | ICD-10-CM | POA: Diagnosis not present

## 2023-09-18 DIAGNOSIS — E7800 Pure hypercholesterolemia, unspecified: Secondary | ICD-10-CM | POA: Diagnosis not present

## 2023-09-18 DIAGNOSIS — N3281 Overactive bladder: Secondary | ICD-10-CM | POA: Diagnosis not present

## 2023-09-30 ENCOUNTER — Encounter: Payer: Self-pay | Admitting: Gastroenterology

## 2023-09-30 ENCOUNTER — Ambulatory Visit: Payer: BC Managed Care – PPO | Admitting: Gastroenterology

## 2023-09-30 VITALS — BP 94/68 | HR 67 | Ht 66.0 in | Wt 149.2 lb

## 2023-09-30 DIAGNOSIS — Z83719 Family history of colon polyps, unspecified: Secondary | ICD-10-CM | POA: Diagnosis not present

## 2023-09-30 DIAGNOSIS — K9041 Non-celiac gluten sensitivity: Secondary | ICD-10-CM | POA: Diagnosis not present

## 2023-09-30 DIAGNOSIS — R194 Change in bowel habit: Secondary | ICD-10-CM | POA: Diagnosis not present

## 2023-09-30 DIAGNOSIS — R195 Other fecal abnormalities: Secondary | ICD-10-CM

## 2023-09-30 NOTE — Progress Notes (Unsigned)
GASTROENTEROLOGY OUTPATIENT CLINIC VISIT   Primary Care Provider Jarrett Soho, PA-C [address] [place] [address] [phone]  Referring Provider Jarrett Soho, PA-C [address] [place],  [place] [address] [phone]  Patient Profile: Adriana Romero is a 62 y.o. female with a pmh significant for allergies, rosacea, status post hysterectomy, diverticulosis, family history colon cancer/polyps.  The patient presents to the [facility] for an evaluation and management of problem(s) noted below:  Problem List 1. Dark stools   2. Change in bowel habits   3. Family history of colonic polyps   4. Gluten intolerance    Discussed the use of AI scribe software for clinical note transcription with the patient, who gave verbal consent to proceed.  History of Present Illness This is the patient's first visit to the [facility] GI clinic.  She was seen in 2022 for a screening high risk colonoscopy with my previous partner Dr. Orvan Falconer and is on a 5-year follow-up surveillance/screening protocol.   Over the last 45-months, she has noted intermittent episodes of dark stools (1/2 of the bowel will be darker-black in color and the other half brown).  The patient reported five episodes in total, with the last episode occurring six weeks prior to today's consultation.  The patient also reported mild cramping during the last couple of episodes, which they likened to premenstrual cramps, but she has had prior hysterectomy.  The patient denied any association of these episodes with specific food intake, although they noted that one episode occurred after consuming a large amount of cheese. The patient follows a gluten-free diet due to gluten intolerance, which manifests as joint pain and flu-like symptoms when they consume gluten though she has never been formally tested for Celiac per her report (she has followed GFD for 15+ years).  The patient's bowel habits are  generally regular, with daily bowel movements occurring mostly in the morning. They reported that the consistency of their stools has become less solid with age, but this has been managed with the regular intake of acacia fiber.  She reports recent blood work at PCP having shown no issues in regard to overt anemia. She denies other symptoms such as dysphagia, heartburn, reflux, or indigestion. The patient has been under significant life stress over the past year and a half, which she wonders if it could be contributing to her symptoms.  She has never had an EGD.  She does not use NSAIDs.   GI Review of Systems Positive as above Negative for nausea, vomiting, hematochezia  Review of Systems General: Denies fevers/chills/weight loss unintentionally Cardiovascular: Denies chest pain Pulmonary: Denies shortness of breath Gastroenterological: See HPI Genitourinary: Denies darkened urine or hematuria Hematological: Denies easy bruising/bleeding Dermatological: Denies jaundice Psychological: Mood is stable   Medications Current Outpatient Medications  Medication Sig Dispense Refill   acetaminophen (TYLENOL) 325 MG tablet Take 650 mg by mouth every 6 (six) hours as needed.     AMBULATORY NON FORMULARY MEDICATION Medication Name: Biotin with collagen daily     AMBULATORY NON FORMULARY MEDICATION Medication Name: Omega 7 with seabuck thorne once daily.     Bioflavonoid Products (GRAPE SEED EXTRACT PO) Take 1 tablet by mouth daily.     cholecalciferol (VITAMIN D3) 25 MCG (1000 UNIT) tablet Take 1,000 Units by mouth daily.     Fiber POWD Take by mouth 3 (three) times a week.     oxybutynin (DITROPAN-XL) 10 MG 24 hr tablet Take 10 mg by mouth daily. Only  uses every other day or every third day     Polyethyl Glycol-Propyl Glycol (SYSTANE) 0.4-0.3 % SOLN Apply 1 drop to eye daily as needed.     Red Yeast Rice Extract (RED YEAST RICE PO) Take 1 tablet by mouth daily.     sodium chloride (OCEAN) 0.65 %  SOLN nasal spray Place 1 spray into both nostrils as needed for congestion.     timolol (TIMOPTIC) 0.5 % ophthalmic solution Place 1 drop into the right eye every morning.     vitamin B-12 (CYANOCOBALAMIN) 100 MCG tablet Take 100 mcg by mouth every other day.     No current facility-administered medications for this visit.    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   Rosacea    Seasonal allergies    Past Surgical History:  Procedure Laterality Date   BREAST BIOPSY  2004   BREAST EXCISIONAL BIOPSY Right 2004   COLONOSCOPY  2012   right ulner nerve transposition     2005   TONSILLECTOMY     age 67   VAGINAL HYSTERECTOMY  age 27   irregular bleeding   WISDOM TOOTH EXTRACTION     Social History   Socioeconomic History   Marital status: Married    Spouse name: Not on file   Number of children: Not on file   Years of education: Not on file   Highest education level: Not on file  Occupational History   Occupation: Surveyor, quantity admin  Tobacco Use   Smoking status: Never    Passive exposure: Past (Mother and grandparents as a child)   Smokeless tobacco: Never  Vaping Use   Vaping status: Never Used  Substance and Sexual Activity   Alcohol use: Not Currently   Drug use: No   Sexual activity: Yes    Comment: 1st intercourse 62 yo-More than 5 partners  Other Topics Concern   Not on file  Social History Narrative   Not on file   Social Determinants of Health   Financial Resource Strain: Not on file  Food Insecurity: No Food Insecurity (12/04/2022)   Hunger Vital Sign    Worried About Running Out of Food in the Last  Year: Never true    Ran Out of Food in the Last Year: Never true  Transportation Needs: No Transportation Needs (12/04/2022)   PRAPARE - Administrator, Civil Service (Medical): No    Lack of Transportation (Non-Medical): No  Physical Activity: Not on file  Stress: Not on file  Social Connections: Not on file  Intimate Partner Violence: Not on file   Family History  Problem Relation Age of Onset   Emphysema Mother    Heart disease Mother    Colon polyps Father    Clotting disorder Father    Skin cancer Father    Hypertension Father    Skin cancer Sister    Allergies Brother    Skin cancer Brother    Colon cancer Paternal Aunt    Cancer Paternal Aunt        Colon  Breast cancer Maternal Grandmother 62   Asthma Other        nephew   Allergies Other        nephew   Esophageal cancer Neg Hx    Rectal cancer Neg Hx    Stomach cancer Neg Hx    Pancreatic cancer Neg Hx    Inflammatory bowel disease Neg Hx    Liver disease Neg Hx    I have reviewed her medical, social, and family history in detail and updated the electronic medical record as necessary.    PHYSICAL EXAMINATION  BP 94/68   Pulse 67   Ht 5\' 6"  (1.676 m)   Wt 149 lb 4 oz (67.7 kg)   SpO2 97%   BMI 24.09 kg/m  Wt Readings from Last 3 Encounters:  09/30/23 149 lb 4 oz (67.7 kg)  05/12/22 160 lb 3.2 oz (72.7 kg)  04/11/22 163 lb 3.2 oz (74 kg)  GEN: NAD, appears stated age, doesn't appear chronically ill PSYCH: Cooperative, without pressured speech EYE: Conjunctivae pink, sclerae anicteric ENT: MMM CV: Nontachycardic RESP: No audible wheezing GI: NABS, soft, NT/ND, without rebound or guarding MSK/EXT: No significant lower extremity edema SKIN: No jaundice NEURO:  Alert & Oriented x 3, no focal deficits   REVIEW OF DATA  I reviewed the following data at the time of this encounter:  GI Procedures and Studies  2022 colonoscopy - Non-bleeding internal hemorrhoids. - Diverticulosis in the  sigmoid colon and in the descending colon. - One 4 mm polyp in the proximal rectum, removed with a cold snare. Resected and retrieved. - The examination was otherwise normal on direct and retroflexion views.  Laboratory Studies  Reviewed those in epic We will try to obtain the [facility] PCP laboratory records  Imaging Studies  No relevant studies to review   ASSESSMENT  Ms. Colantonio is a 62 y.o. female with a pmh significant for allergies, rosacea, status post hysterectomy, diverticulosis, family history colon cancer/polyps.  The patient is seen today for evaluation and management of:  1. Dark stools   2. Change in bowel habits   3. Family history of colonic polyps   4. Gluten intolerance    The patient is hemodynamically and clinically stable today.  In the setting of her recent episodes of dark stools of unclear etiology, it is not clear the etiology of this.  She is not having upper GI symptoms that would be more concerning for PUD but she has never had an upper endoscopy.  After discussion, and with her having reported normal blood counts recently, I think it is reasonable for [place] to plan a watchful wait.  Will plan to repeat her blood counts and iron indices in 6 weeks.  If she has evidence of iron deficiency, then we will need to pursue an upper endoscopy.  If she does not have evidence of iron deficiency, she does not have recurrence of symptoms with dark stools, then we will plan to just watch her for now.  In the setting of her history of gluten intolerance, she states she is never been checked for celiac disease.  Although she has been following a GFD, we will go ahead and plan to send a TTG, to see if this is elevated or not (though we will understand that it could be falsely negative since she has been following a GFD).  She will be due for colon cancer screening in 2027 and we will update that to my name from her previous partner.  She will update [place] if something else develops in the interim.   All patient questions were answered to the best of my ability, and the patient agrees to the aforementioned plan of action with follow-up as indicated.   PLAN  CBC/iron/TIBC/ferritin to be drawn in 6 weeks - If iron deficiency is found, will consider EGD +/- VCE Patient will update [place] if she has any further episodes before her blood counts are rechecked - If she does, then we will proceed with an upper endoscopy and earlier repeat blood counts and iron indices Colonoscopy in 2027   Orders Placed This Encounter  Procedures   CBC   Iron, TIBC and Ferritin Panel    New Prescriptions   No medications on file   Modified Medications   No medications on file    Planned Follow Up No follow-ups on file.   Total Time in Face-to-Face and in Coordination of Care for patient including independent/personal interpretation/review of prior testing, medical history, examination, medication adjustment, communicating results with the patient directly, and documentation within the EHR is 25 minutes.   Corliss Parish, MD [facility] Office # [phone]

## 2023-09-30 NOTE — Patient Instructions (Signed)
Please in for labs the week of 10/1123  or the week of 11/09/23 to have labs.   Send my chart message with updates.   _______________________________________________________  If your blood pressure at your visit was 140/90 or greater, please contact your primary care physician to follow up on this.  _______________________________________________________  If you are age 63 or older, your body mass index should be between 23-30. Your Body mass index is 24.09 kg/m. If this is out of the aforementioned range listed, please consider follow up with your Primary Care Provider.  If you are age 11 or younger, your body mass index should be between 19-25. Your Body mass index is 24.09 kg/m. If this is out of the aformentioned range listed, please consider follow up with your Primary Care Provider.   ________________________________________________________  The [place] [facility] providers would like to encourage you to use [facility] to communicate with providers for non-urgent requests or questions.  Due to long hold times on the telephone, sending your provider a message by [facility] may be a faster and more efficient way to get a response.  Please allow 48 business hours for a response.  Please remember that this is for non-urgent requests.  _______________________________________________________  Thank you for choosing me and [facility].  Dr. Meridee Score

## 2023-10-01 ENCOUNTER — Encounter: Payer: Self-pay | Admitting: Gastroenterology

## 2023-10-01 DIAGNOSIS — K9041 Non-celiac gluten sensitivity: Secondary | ICD-10-CM | POA: Insufficient documentation

## 2023-10-01 DIAGNOSIS — Z83719 Family history of colon polyps, unspecified: Secondary | ICD-10-CM | POA: Insufficient documentation

## 2023-10-01 DIAGNOSIS — R195 Other fecal abnormalities: Secondary | ICD-10-CM | POA: Insufficient documentation

## 2023-10-01 DIAGNOSIS — R194 Change in bowel habit: Secondary | ICD-10-CM | POA: Insufficient documentation

## 2023-10-05 ENCOUNTER — Other Ambulatory Visit: Payer: Self-pay

## 2023-10-05 DIAGNOSIS — R195 Other fecal abnormalities: Secondary | ICD-10-CM

## 2023-10-05 DIAGNOSIS — R194 Change in bowel habit: Secondary | ICD-10-CM

## 2023-10-07 DIAGNOSIS — H35033 Hypertensive retinopathy, bilateral: Secondary | ICD-10-CM | POA: Diagnosis not present

## 2023-10-07 DIAGNOSIS — H3582 Retinal ischemia: Secondary | ICD-10-CM | POA: Diagnosis not present

## 2023-10-07 DIAGNOSIS — H4051X1 Glaucoma secondary to other eye disorders, right eye, mild stage: Secondary | ICD-10-CM | POA: Diagnosis not present

## 2023-10-07 DIAGNOSIS — H348312 Tributary (branch) retinal vein occlusion, right eye, stable: Secondary | ICD-10-CM | POA: Diagnosis not present

## 2023-11-10 ENCOUNTER — Inpatient Hospital Stay
Admission: RE | Admit: 2023-11-10 | Discharge: 2023-11-10 | Disposition: A | Discharge status: Home / Self Care | Payer: BC Managed Care – PPO | Source: Ambulatory Visit | Attending: Family Medicine | Admitting: Family Medicine

## 2023-11-10 DIAGNOSIS — E2839 Other primary ovarian failure: Secondary | ICD-10-CM | POA: Diagnosis not present

## 2023-11-10 DIAGNOSIS — N958 Other specified menopausal and perimenopausal disorders: Secondary | ICD-10-CM | POA: Diagnosis not present

## 2023-11-10 DIAGNOSIS — M8588 Other specified disorders of bone density and structure, other site: Secondary | ICD-10-CM | POA: Diagnosis not present

## 2023-11-11 ENCOUNTER — Other Ambulatory Visit (INDEPENDENT_AMBULATORY_CARE_PROVIDER_SITE_OTHER): Payer: BC Managed Care – PPO

## 2023-11-11 ENCOUNTER — Other Ambulatory Visit: Payer: Self-pay

## 2023-11-11 DIAGNOSIS — R194 Change in bowel habit: Secondary | ICD-10-CM

## 2023-11-11 DIAGNOSIS — R195 Other fecal abnormalities: Secondary | ICD-10-CM

## 2023-11-11 LAB — IBC + FERRITIN
Ferritin: 23.6 ng/mL (ref 10.0–291.0)
Iron: 97 ug/dL (ref 42–145)
Saturation Ratios: 28.9 % (ref 20.0–50.0)
TIBC: 336 ug/dL (ref 250.0–450.0)
Transferrin: 240 mg/dL (ref 212.0–360.0)

## 2023-11-11 LAB — CBC
HCT: 38.1 % (ref 36.0–46.0)
Hemoglobin: 12.5 g/dL (ref 12.0–15.0)
MCHC: 32.9 g/dL (ref 30.0–36.0)
MCV: 94.4 fL (ref 78.0–100.0)
Platelets: 211 10*3/uL (ref 150.0–400.0)
RBC: 4.04 Mil/uL (ref 3.87–5.11)
RDW: 13 % (ref 11.5–15.5)
WBC: 5.8 10*3/uL (ref 4.0–10.5)

## 2023-11-12 LAB — TISSUE TRANSGLUTAMINASE, IGA: (tTG) Ab, IgA: <1 U/mL

## 2023-11-12 LAB — IGA: Immunoglobulin A: 101 mg/dL (ref 70–320)

## 2024-01-13 DIAGNOSIS — H40051 Ocular hypertension, right eye: Secondary | ICD-10-CM | POA: Diagnosis not present

## 2024-01-13 DIAGNOSIS — H4051X1 Glaucoma secondary to other eye disorders, right eye, mild stage: Secondary | ICD-10-CM | POA: Diagnosis not present

## 2024-01-13 DIAGNOSIS — H348312 Tributary (branch) retinal vein occlusion, right eye, stable: Secondary | ICD-10-CM | POA: Diagnosis not present

## 2024-02-17 DIAGNOSIS — D23112 Other benign neoplasm of skin of right lower eyelid, including canthus: Secondary | ICD-10-CM | POA: Diagnosis not present

## 2024-02-17 DIAGNOSIS — H40013 Open angle with borderline findings, low risk, bilateral: Secondary | ICD-10-CM | POA: Diagnosis not present

## 2024-02-17 DIAGNOSIS — H5340 Unspecified visual field defects: Secondary | ICD-10-CM | POA: Diagnosis not present

## 2024-02-17 DIAGNOSIS — H2513 Age-related nuclear cataract, bilateral: Secondary | ICD-10-CM | POA: Diagnosis not present

## 2024-02-17 DIAGNOSIS — H5203 Hypermetropia, bilateral: Secondary | ICD-10-CM | POA: Diagnosis not present

## 2024-03-08 DIAGNOSIS — L650 Telogen effluvium: Secondary | ICD-10-CM | POA: Diagnosis not present

## 2024-03-08 DIAGNOSIS — L821 Other seborrheic keratosis: Secondary | ICD-10-CM | POA: Diagnosis not present

## 2024-03-08 DIAGNOSIS — L570 Actinic keratosis: Secondary | ICD-10-CM | POA: Diagnosis not present

## 2024-03-08 DIAGNOSIS — Z85828 Personal history of other malignant neoplasm of skin: Secondary | ICD-10-CM | POA: Diagnosis not present

## 2024-03-08 DIAGNOSIS — D225 Melanocytic nevi of trunk: Secondary | ICD-10-CM | POA: Diagnosis not present

## 2024-04-13 DIAGNOSIS — H903 Sensorineural hearing loss, bilateral: Secondary | ICD-10-CM | POA: Diagnosis not present

## 2024-06-06 ENCOUNTER — Other Ambulatory Visit: Payer: Self-pay | Admitting: Family Medicine

## 2024-06-06 DIAGNOSIS — Z1231 Encounter for screening mammogram for malignant neoplasm of breast: Secondary | ICD-10-CM

## 2024-07-13 ENCOUNTER — Ambulatory Visit
Admission: RE | Admit: 2024-07-13 | Discharge: 2024-07-13 | Disposition: A | Discharge status: Home / Self Care | Source: Ambulatory Visit

## 2024-07-13 DIAGNOSIS — Z1231 Encounter for screening mammogram for malignant neoplasm of breast: Secondary | ICD-10-CM

## 2024-09-07 DIAGNOSIS — N3281 Overactive bladder: Secondary | ICD-10-CM | POA: Diagnosis not present

## 2024-09-07 DIAGNOSIS — E7800 Pure hypercholesterolemia, unspecified: Secondary | ICD-10-CM | POA: Diagnosis not present

## 2024-09-07 DIAGNOSIS — Z0000 Encounter for general adult medical examination without abnormal findings: Secondary | ICD-10-CM | POA: Diagnosis not present

## 2024-09-07 DIAGNOSIS — E559 Vitamin D deficiency, unspecified: Secondary | ICD-10-CM | POA: Diagnosis not present
